# Patient Record
Sex: Female | Born: 1950 | Race: White | Hispanic: No | Smoking: Former smoker
Health system: Southern US, Community
[De-identification: ages and names within clinical notes are randomized; demographics above are authoritative.]

## PROBLEM LIST (undated history)

## (undated) DIAGNOSIS — K219 Gastro-esophageal reflux disease without esophagitis: Secondary | ICD-10-CM

## (undated) DIAGNOSIS — M1990 Unspecified osteoarthritis, unspecified site: Secondary | ICD-10-CM

## (undated) DIAGNOSIS — E7800 Pure hypercholesterolemia, unspecified: Secondary | ICD-10-CM

## (undated) DIAGNOSIS — J449 Chronic obstructive pulmonary disease, unspecified: Secondary | ICD-10-CM

## (undated) DIAGNOSIS — I739 Peripheral vascular disease, unspecified: Secondary | ICD-10-CM

## (undated) DIAGNOSIS — I10 Essential (primary) hypertension: Secondary | ICD-10-CM

## (undated) HISTORY — DX: Peripheral vascular disease, unspecified: I73.9

## (undated) HISTORY — DX: Chronic obstructive pulmonary disease, unspecified: J44.9

## (undated) HISTORY — PX: EYE SURGERY: SHX253

## (undated) HISTORY — PX: DEBRIDEMENT  FOOT: SUR387

---

## 2010-04-12 ENCOUNTER — Ambulatory Visit
Admission: RE | Admit: 2010-04-12 | Discharge: 2010-04-12 | Payer: Self-pay | Source: Home / Self Care | Attending: Vascular Surgery | Admitting: Vascular Surgery

## 2010-08-14 NOTE — Assessment & Plan Note (Signed)
OFFICE VISIT   BURDA, Pamela [name]  DOB:  April 30, 1950                                       04/12/2010  [id number]#:[id number]   CHIEF COMPLAINT:  Right foot ulcer.   HISTORY OF PRESENT ILLNESS:  The patient is a 60 year old female  referred by Dr. Nolen Mu for a nonhealing wound of her right foot.  The  patient developed an ulceration on her right first toe in September of  2011.  Dr. Nolen Mu has been doing intermittent wound care and  debridement on this but the wound has persisted.  The patient has a  lengthy history of numbness and tingling in both feet for approximately  5 years.  She has a 10 year history of diabetes.  Other chronic medical  problems include COPD, hypertension and elevated cholesterol.  These  problems are all currently controlled and followed by Dr. Clarise Cruz.   The patient denies any rest pain.  She denies any claudication symptoms.  She has had no other prior episodes of ulcerations on her feet.   PAST SURGICAL HISTORY:  C-section in 1988.   SOCIAL HISTORY:  She is disabled, unemployed.  She has 2 children.  She  is single.  She currently smokes half pack of cigarettes per day and  greater than 3 minutes were spent today regarding smoking cessation  counseling.  She does not consume alcohol regularly.   FAMILY HISTORY:  Has no history of early heart or vascular disease at  age less than 97.   REVIEW OF SYSTEMS:  Full 12 point review of systems was performed with  the patient today.  VASCULAR:  She has intermittent numbness, tingling and pain in her feet.  GENERAL:  She is 5 feet 6 inches, 231 pounds.  GI:  She has a history of reflux and hiatal hernia.  PULMONARY:  She has a history of bronchitis and wheezing.  URINARY:  She has urinary frequency.  MUSCULOSKELETAL:  She has multiple joint and arthritis pain.  All other systems were negative.   ALLERGIES:  She has allergies listed to Cipro, codeine and Azo Gantrisin  which causes  hives.   MEDICATIONS:  1. Include nabumetone 750 mg 2 tablets every day.  2. Amlodipine/benazepril 10/40 one tablet every day.  3. Lovaza 1 gram 2 capsules twice daily.  4. Allopurinol 100 mg 2 tablets once a day.  5. Omeprazole DR 20 mg once a day.  6. Vitamin D 1.25 mg every week.  7. Promethazine p.r.n.  8. L-arginine 500 mg once a day.  9. B12 once a day.  10.Alpha lipoic acid 600 mg 2 capsules every day.  11.Gabapentin 800 mg three times a day.  12.Hydroxyzine 25 mg three times a day.  13.Cyclobenzaprine 10 mg three times a day.  14.Bactrim DS 1 tablet twice a day.  15.Lantus insulin 60 units twice a day.  16.NovoLog sliding scale insulin.  17.Stool softener once daily.   PHYSICAL EXAMINATION:  Vital signs:  Blood pressure is 129/77 in the  left arm, heart rate 89 and regular.  Temperature is 98.5.  HEENT:  Unremarkable.  Neck:  Has 2+ carotid pulses without bruit.  Chest:  Clear to auscultation with a few crackles in the right base.  Cardiac:  Regular rate and rhythm without murmur.  Abdomen:  Very obese, soft,  nontender, nondistended.  No masses.  Extremities:  She has 1+ femoral  pulses bilaterally.  She has absent popliteal and pedal pulses  bilaterally.  Musculoskeletal:  Shows no obvious major joint  deformities.  Neurologic:  Shows symmetric upper extremity and lower  extremity motor strength with decreased sensation in the feet  bilaterally.  Skin:  There is a 2 cm ulceration on the plantar aspect of  the right first toe consistent with a neuropathic ulcer.  There is pale  granulation tissue and poor healing at the base of the wound.   I reviewed her right bilateral ABIs dated 03/26/2010 from [facility] which shows an ABI on the right of 0.63, left of  0.57.  Waveforms were biphasic to monophasic.   I also reviewed the results of an MRI scan that was done to rule out  osteomyelitis.  This was consistent with cellulitis but no obvious   osteomyelitis in the right first toe.   In summary, the patient has a nonhealing ulceration right foot.  I agree  with Dr. 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  I believe the best option for her would be aortogram,  bilateral lower extremity runoff, possible angioplasty and stenting or  possible consideration of a bypass operation in her right leg.  Risks,  benefits, possible complications and procedure details including but not  limited to bleeding, infection, vessel injury, contrast reaction were  explained to the patient today.  She understands and agrees to proceed.  Her angiogram is scheduled for 04/27/2010.     Janetta Hora. Fields, MD  Electronically Signed   CEF/MEDQ  D:  04/12/2010  T:  04/13/2010  Job:  [id number]   cc:   B. Theola Sequin, MD  Joya Salm

## 2011-03-29 ENCOUNTER — Emergency Department (HOSPITAL_COMMUNITY): Payer: Medicaid - Out of State

## 2011-03-29 ENCOUNTER — Inpatient Hospital Stay (HOSPITAL_COMMUNITY)
Admission: EM | Admit: 2011-03-29 | Discharge: 2011-04-12 | DRG: 617 | Disposition: A | Discharge status: Skilled Nursing Facility | Payer: Medicaid - Out of State | Attending: Internal Medicine | Admitting: Internal Medicine

## 2011-03-29 ENCOUNTER — Encounter: Payer: Self-pay | Admitting: Emergency Medicine

## 2011-03-29 DIAGNOSIS — I70269 Atherosclerosis of native arteries of extremities with gangrene, unspecified extremity: Secondary | ICD-10-CM | POA: Diagnosis present

## 2011-03-29 DIAGNOSIS — L03119 Cellulitis of unspecified part of limb: Secondary | ICD-10-CM | POA: Diagnosis present

## 2011-03-29 DIAGNOSIS — K219 Gastro-esophageal reflux disease without esophagitis: Secondary | ICD-10-CM | POA: Diagnosis present

## 2011-03-29 DIAGNOSIS — Z6835 Body mass index (BMI) 35.0-35.9, adult: Secondary | ICD-10-CM

## 2011-03-29 DIAGNOSIS — N189 Chronic kidney disease, unspecified: Secondary | ICD-10-CM | POA: Diagnosis present

## 2011-03-29 DIAGNOSIS — E1100 Type 2 diabetes mellitus with hyperosmolarity without nonketotic hyperglycemic-hyperosmolar coma (NKHHC): Secondary | ICD-10-CM | POA: Diagnosis present

## 2011-03-29 DIAGNOSIS — E875 Hyperkalemia: Secondary | ICD-10-CM | POA: Diagnosis not present

## 2011-03-29 DIAGNOSIS — R739 Hyperglycemia, unspecified: Secondary | ICD-10-CM

## 2011-03-29 DIAGNOSIS — E119 Type 2 diabetes mellitus without complications: Secondary | ICD-10-CM | POA: Diagnosis present

## 2011-03-29 DIAGNOSIS — E11621 Type 2 diabetes mellitus with foot ulcer: Secondary | ICD-10-CM | POA: Diagnosis present

## 2011-03-29 DIAGNOSIS — K5900 Constipation, unspecified: Secondary | ICD-10-CM | POA: Diagnosis not present

## 2011-03-29 DIAGNOSIS — M7989 Other specified soft tissue disorders: Secondary | ICD-10-CM | POA: Diagnosis not present

## 2011-03-29 DIAGNOSIS — I739 Peripheral vascular disease, unspecified: Secondary | ICD-10-CM | POA: Diagnosis present

## 2011-03-29 DIAGNOSIS — E871 Hypo-osmolality and hyponatremia: Secondary | ICD-10-CM | POA: Diagnosis present

## 2011-03-29 DIAGNOSIS — E1149 Type 2 diabetes mellitus with other diabetic neurological complication: Secondary | ICD-10-CM | POA: Diagnosis present

## 2011-03-29 DIAGNOSIS — L02619 Cutaneous abscess of unspecified foot: Secondary | ICD-10-CM | POA: Diagnosis present

## 2011-03-29 DIAGNOSIS — L97509 Non-pressure chronic ulcer of other part of unspecified foot with unspecified severity: Secondary | ICD-10-CM | POA: Diagnosis present

## 2011-03-29 DIAGNOSIS — R259 Unspecified abnormal involuntary movements: Secondary | ICD-10-CM | POA: Diagnosis not present

## 2011-03-29 DIAGNOSIS — L03116 Cellulitis of left lower limb: Secondary | ICD-10-CM

## 2011-03-29 DIAGNOSIS — I129 Hypertensive chronic kidney disease with stage 1 through stage 4 chronic kidney disease, or unspecified chronic kidney disease: Secondary | ICD-10-CM | POA: Diagnosis present

## 2011-03-29 DIAGNOSIS — I5030 Unspecified diastolic (congestive) heart failure: Secondary | ICD-10-CM | POA: Diagnosis present

## 2011-03-29 DIAGNOSIS — I10 Essential (primary) hypertension: Secondary | ICD-10-CM

## 2011-03-29 DIAGNOSIS — E1165 Type 2 diabetes mellitus with hyperglycemia: Principal | ICD-10-CM | POA: Diagnosis present

## 2011-03-29 DIAGNOSIS — M1990 Unspecified osteoarthritis, unspecified site: Secondary | ICD-10-CM | POA: Diagnosis present

## 2011-03-29 DIAGNOSIS — F17200 Nicotine dependence, unspecified, uncomplicated: Secondary | ICD-10-CM | POA: Diagnosis present

## 2011-03-29 DIAGNOSIS — I509 Heart failure, unspecified: Secondary | ICD-10-CM | POA: Diagnosis present

## 2011-03-29 DIAGNOSIS — M109 Gout, unspecified: Secondary | ICD-10-CM | POA: Diagnosis present

## 2011-03-29 DIAGNOSIS — IMO0002 Reserved for concepts with insufficient information to code with codable children: Principal | ICD-10-CM | POA: Diagnosis present

## 2011-03-29 DIAGNOSIS — E7800 Pure hypercholesterolemia, unspecified: Secondary | ICD-10-CM | POA: Diagnosis present

## 2011-03-29 DIAGNOSIS — Z01810 Encounter for preprocedural cardiovascular examination: Secondary | ICD-10-CM

## 2011-03-29 DIAGNOSIS — J4489 Other specified chronic obstructive pulmonary disease: Secondary | ICD-10-CM | POA: Diagnosis present

## 2011-03-29 DIAGNOSIS — N183 Chronic kidney disease, stage 3 unspecified: Secondary | ICD-10-CM

## 2011-03-29 DIAGNOSIS — J449 Chronic obstructive pulmonary disease, unspecified: Secondary | ICD-10-CM | POA: Diagnosis present

## 2011-03-29 DIAGNOSIS — E1142 Type 2 diabetes mellitus with diabetic polyneuropathy: Secondary | ICD-10-CM | POA: Diagnosis present

## 2011-03-29 DIAGNOSIS — Z794 Long term (current) use of insulin: Secondary | ICD-10-CM

## 2011-03-29 HISTORY — DX: Pure hypercholesterolemia, unspecified: E78.00

## 2011-03-29 HISTORY — DX: Gastro-esophageal reflux disease without esophagitis: K21.9

## 2011-03-29 HISTORY — DX: Unspecified osteoarthritis, unspecified site: M19.90

## 2011-03-29 HISTORY — DX: Essential (primary) hypertension: I10

## 2011-03-29 LAB — DIFFERENTIAL
Basophils Relative: 0 % (ref 0–1)
Eosinophils Absolute: 0 10*3/uL (ref 0.0–0.7)
Lymphs Abs: 2.8 10*3/uL (ref 0.7–4.0)
Neutro Abs: 15.7 10*3/uL — ABNORMAL HIGH (ref 1.7–7.7)
Neutrophils Relative %: 77 % (ref 43–77)

## 2011-03-29 LAB — CBC
MCH: 31.7 pg (ref 26.0–34.0)
MCHC: 33.1 g/dL (ref 30.0–36.0)
Platelets: 270 10*3/uL (ref 150–400)
RBC: 3.41 MIL/uL — ABNORMAL LOW (ref 3.87–5.11)

## 2011-03-29 MED ORDER — SODIUM CHLORIDE 0.9 % IV SOLN: INTRAVENOUS | Status: DC

## 2011-03-29 MED ORDER — PIPERACILLIN-TAZOBACTAM 3.375 G IVPB
3.3750 g | Freq: Once | INTRAVENOUS | Status: AC
  Administered 2011-03-29: 3.375 g via INTRAVENOUS
  Filled 2011-03-29: qty 50

## 2011-03-29 MED ORDER — HYDROMORPHONE HCL PF 1 MG/ML IJ SOLN
1.0000 mg | Freq: Once | INTRAMUSCULAR | Status: AC
  Administered 2011-03-29: 1 mg via INTRAVENOUS
  Filled 2011-03-29: qty 1

## 2011-03-29 MED ORDER — ONDANSETRON HCL 4 MG/2ML IJ SOLN
4.0000 mg | Freq: Once | INTRAMUSCULAR | Status: AC
  Administered 2011-03-29: 4 mg via INTRAVENOUS
  Filled 2011-03-29: qty 2

## 2011-03-29 MED ORDER — VANCOMYCIN HCL IN DEXTROSE 1-5 GM/200ML-% IV SOLN
1000.0000 mg | Freq: Once | INTRAVENOUS | Status: AC
  Administered 2011-03-29: 1000 mg via INTRAVENOUS
  Filled 2011-03-29: qty 200

## 2011-03-29 NOTE — ED Notes (Signed)
Pt requesting to have something to drink, advised her that she needed to wait until seen by MD.  Pt st's she was gonna have something to drink, sent her daughter to get a Coke.  Pt also stated that she was gonna take a pain pill and proceded to put it in her mouth and swallow it.

## 2011-03-29 NOTE — ED Provider Notes (Signed)
History     CSN: [id number]  Arrival date & time 03/29/11  1749   First MD Initiated Contact with Patient 03/29/11 2234      Chief Complaint  Patient presents with  . Foot Pain    (Consider location/radiation/quality/duration/timing/severity/associated sxs/prior treatment) HPI Comments: Patient with history of diabetes recently being treated for ulceration of plantar aspect of left foot -- presents with worsening ulcer on the top of her foot over the base of the fourth and fifth toes. The patient has had some pain in this area and worsening redness around the ulceration. The patient's daughter noted that the toe went from a white color to a purplish color today. There has been minimal oozing from the wound. The patient has had occasional fevers for the past week. She denies any nausea, vomiting, and diarrhea. Patient has been taking Percocet at home for pain.  Patient is a 60 y.o. female presenting with lower extremity pain. The history is provided by the patient.  Foot Pain This is a new problem. The current episode started in the past 7 days. The problem has been rapidly worsening. Associated symptoms include a fever and a rash. Pertinent negatives include no abdominal pain, arthralgias, chest pain, chills, congestion, coughing, headaches, myalgias, nausea, sore throat or vomiting. The symptoms are aggravated by nothing. She has tried nothing for the symptoms.  Foot Pain This is a new problem. The current episode started in the past 7 days. The problem has been rapidly worsening. Pertinent negatives include no chest pain, no abdominal pain, no headaches and no shortness of breath. The symptoms are aggravated by nothing. She has tried nothing for the symptoms.    Past Medical History  Diagnosis Date  . Diabetes mellitus   . GERD (gastroesophageal reflux disease)   . Gout   . Hypertension   . High cholesterol     Past Surgical History  Procedure Date  . Cesarean section     No  family history on file.  History  Substance Use Topics  . Smoking status: Passive Smoker  . Smokeless tobacco: Not on file  . Alcohol Use: No    OB History    Grav Para Term Preterm Abortions TAB SAB Ect Mult Living                  Review of Systems  Constitutional: Positive for fever. Negative for chills.  HENT: Negative for congestion, sore throat and rhinorrhea.   Eyes: Negative for discharge.  Respiratory: Negative for cough and shortness of breath.   Cardiovascular: Negative for chest pain.  Gastrointestinal: Negative for nausea, vomiting, abdominal pain, diarrhea and constipation.  Genitourinary: Negative for dysuria.  Musculoskeletal: Negative for myalgias and arthralgias.  Skin: Positive for color change, rash and wound.  Neurological: Negative for headaches.  Psychiatric/Behavioral: Negative for confusion.    Allergies  Azo and Codeine  Home Medications   Current Outpatient Rx  Name Route Sig Dispense Refill  . ALLOPURINOL 300 MG PO TABS Oral Take 300 mg by mouth daily.      Marland Kitchen AMLODIPINE BESYLATE 10 MG PO TABS Oral Take 10 mg by mouth daily.      . ATORVASTATIN CALCIUM 10 MG PO TABS Oral Take 10 mg by mouth daily.      Marland Kitchen VITAMIN B-12 PO Oral Take 1 tablet by mouth daily.      . CYCLOBENZAPRINE HCL 10 MG PO TABS Oral Take 10 mg by mouth 3 (three) times daily.      Marland Kitchen  DOXYCYCLINE HYCLATE 100 MG PO CAPS Oral Take 100 mg by mouth 2 (two) times daily.      . ERGOCALCIFEROL 50000 UNITS PO CAPS Oral Take 50,000 Units by mouth once a week.      Marland Kitchen GABAPENTIN 800 MG PO TABS Oral Take 800 mg by mouth 3 (three) times daily.      Marland Kitchen HYDROCHLOROTHIAZIDE 25 MG PO TABS Oral Take 25 mg by mouth daily.      Marland Kitchen HYDROXYZINE HCL 25 MG PO TABS Oral Take 25 mg by mouth every 6 (six) hours as needed. For itching     . INSULIN ASPART 100 UNIT/ML [place] SOLN Subcutaneous Inject 12-30 Units into the skin 2 (two) times daily. Per sliding scale     . INSULIN GLARGINE 100 UNIT/ML [place] SOLN  Subcutaneous Inject 60 Units into the skin 2 (two) times daily.      Marland Kitchen LOSARTAN POTASSIUM 100 MG PO TABS Oral Take 100 mg by mouth daily.      . OMEGA-3-ACID ETHYL ESTERS 1 G PO CAPS Oral Take 4 g by mouth daily.      . OXYCODONE-ACETAMINOPHEN 5-500 MG PO TABS Oral Take 1 tablet by mouth every 8 (eight) hours as needed. For pain     . PROMETHAZINE HCL 25 MG PO TABS Oral Take 25 mg by mouth every 6 (six) hours as needed. For nausea     . SULFAMETHOXAZOLE-TMP DS 800-160 MG PO TABS Oral Take 1 tablet by mouth 2 (two) times daily.        BP 121/53  Pulse 80  Temp(Src) 99.8 F (37.7 C) (Oral)  Resp 18  SpO2 93%  Physical Exam  Nursing note and vitals reviewed. Constitutional: She is oriented to person, place, and time. She appears well-developed and well-nourished.  HENT:  Head: Normocephalic and atraumatic.  Eyes: Right eye exhibits no discharge. Left eye exhibits no discharge.  Neck: Normal range of motion. Neck supple.  Cardiovascular: Normal rate and regular rhythm.  Exam reveals no gallop and no friction rub.   No murmur heard. Pulmonary/Chest: Effort normal and breath sounds normal. No respiratory distress. She has no wheezes.  Abdominal: Soft. There is no tenderness. There is no rebound and no guarding.  Musculoskeletal: Normal range of motion. She exhibits edema and tenderness.       Feet:       Ulceration open wounds over the volar aspect of the fourth and fifth base of toes. There is surrounding redness on the foot consistent with cellulitis. There is no streaking or lymphangitis.  Neurological: She is alert and oriented to person, place, and time. She has normal strength.  Skin: Skin is warm and dry. No rash noted.  Psychiatric: She has a normal mood and affect.    ED Course  Procedures (including critical care time)  Labs Reviewed  CBC - Abnormal; Notable for the following:    WBC 20.5 (*)    RBC 3.41 (*)    Hemoglobin 10.8 (*)    HCT 32.6 (*)    All other  components within normal limits  DIFFERENTIAL - Abnormal; Notable for the following:    Neutro Abs 15.7 (*)    Monocytes Absolute 1.9 (*)    All other components within normal limits  BASIC METABOLIC PANEL - Abnormal; Notable for the following:    Sodium 120 (*) REPEATED TO VERIFY   Chloride 83 (*)    Glucose, Bld 782 (*)    BUN 25 (*)  Creatinine, Ser 1.53 (*)    GFR calc non Af Amer 36 (*)    GFR calc Af Amer 41 (*)    All other components within normal limits  POCT CBG MONITORING   Dg Foot Complete Left  03/29/2011  *RADIOLOGY REPORT*  Clinical Data: Diabetic ulcer the base of the fifth toe.  LEFT FOOT - COMPLETE 3+ VIEW  Comparison: None.  Findings: Soft tissue swelling and soft tissue defect in the lateral aspect of the forefoot over the ankle metatarsal phalangeal region.  There is a suggestion of small gas bubbles in the subcutaneous tissues which might represent soft tissue abscess or infection.  The underlying bones appear intact without evidence of cortical erosion or sclerosis.  No obvious changes of osteomyelitis.  There is thickening of the bone cortex along the shaft of the fourth metatarsal bone which probably represents old healed fracture or stress reaction.  Mild degenerative changes in the intertarsal and tarsometatarsal joints.  Accessory navicular ossicle.  Small plantar calcaneal spur.  Vascular calcifications.  IMPRESSION: Soft tissue defect, swelling, and soft tissue gas in the lateral aspect of the left forefoot consistent with soft tissue infection. No specific evidence of osteomyelitis.  Cortical thickening along the shaft of the fourth metatarsal bone probably representing old fracture deformity or stress reaction.  Degenerative changes in the tarsal joints.  Original Report Authenticated By: Marlon Pel, M.D.     1. Diabetic foot ulcer   2. Cellulitis of left foot   3. Hyperglycemia     11:48 PM Patient seen and examined.  11:49 PM Patient was  discussed with Sunnie Nielsen, MD Seen by Dr. Dierdre Highman. Will admit. X-ray foot reviewed by myself.  12:24 AM [facility] team 5 to admit patient. Patient has elevated glucose, glucose stabilizer protocol started.   MDM  Admit for diabetic foot ulcer, cellulitis, IV abx, hyperglycemia.    Medical screening examination/treatment/procedure(s) were performed by non-physician practitioner and as supervising physician I was immediately available for consultation/collaboration. Osvaldo Human, M.D.    Eustace Moore [place], [place] 03/30/11 0025  Carleene Cooper III, MD 03/30/11 [phone]

## 2011-03-29 NOTE — ED Notes (Signed)
Pt was sent here for infection to left foot.  Pt has been being tx for diabetic ulcer to foot.

## 2011-03-30 ENCOUNTER — Encounter (HOSPITAL_COMMUNITY): Payer: Self-pay | Admitting: Pulmonary Disease

## 2011-03-30 DIAGNOSIS — I10 Essential (primary) hypertension: Secondary | ICD-10-CM | POA: Diagnosis present

## 2011-03-30 DIAGNOSIS — L97509 Non-pressure chronic ulcer of other part of unspecified foot with unspecified severity: Secondary | ICD-10-CM | POA: Diagnosis present

## 2011-03-30 DIAGNOSIS — E119 Type 2 diabetes mellitus without complications: Secondary | ICD-10-CM | POA: Diagnosis present

## 2011-03-30 DIAGNOSIS — L98499 Non-pressure chronic ulcer of skin of other sites with unspecified severity: Secondary | ICD-10-CM

## 2011-03-30 DIAGNOSIS — E871 Hypo-osmolality and hyponatremia: Secondary | ICD-10-CM | POA: Diagnosis present

## 2011-03-30 DIAGNOSIS — Z01810 Encounter for preprocedural cardiovascular examination: Secondary | ICD-10-CM

## 2011-03-30 DIAGNOSIS — M109 Gout, unspecified: Secondary | ICD-10-CM | POA: Diagnosis not present

## 2011-03-30 DIAGNOSIS — K219 Gastro-esophageal reflux disease without esophagitis: Secondary | ICD-10-CM | POA: Diagnosis present

## 2011-03-30 DIAGNOSIS — I739 Peripheral vascular disease, unspecified: Secondary | ICD-10-CM

## 2011-03-30 LAB — GLUCOSE, CAPILLARY
Glucose-Capillary: 288 mg/dL — ABNORMAL HIGH (ref 70–99)
Glucose-Capillary: 423 mg/dL — ABNORMAL HIGH (ref 70–99)
Glucose-Capillary: >600 mg/dL (ref 70–99)

## 2011-03-30 LAB — BASIC METABOLIC PANEL
BUN: 25 mg/dL — ABNORMAL HIGH (ref 6–23)
Chloride: 83 mEq/L — ABNORMAL LOW (ref 96–112)
Chloride: 91 mEq/L — ABNORMAL LOW (ref 96–112)
GFR calc Af Amer: 41 mL/min — ABNORMAL LOW (ref >90)
GFR calc Af Amer: 48 mL/min — ABNORMAL LOW (ref >90)
GFR calc non Af Amer: 36 mL/min — ABNORMAL LOW (ref >90)
Potassium: 4.2 mEq/L (ref 3.5–5.1)
Potassium: 3.6 mEq/L (ref 3.5–5.1)
Sodium: 120 mEq/L — ABNORMAL LOW (ref 135–145)

## 2011-03-30 LAB — CBC
HCT: 29.5 % — ABNORMAL LOW (ref 36.0–46.0)
Hemoglobin: 10 g/dL — ABNORMAL LOW (ref 12.0–15.0)
WBC: 17.7 10*3/uL — ABNORMAL HIGH (ref 4.0–10.5)

## 2011-03-30 LAB — MRSA PCR SCREENING: MRSA by PCR: NEGATIVE

## 2011-03-30 MED ORDER — ALUM & MAG HYDROXIDE-SIMETH 200-200-20 MG/5ML PO SUSP: 30.0000 mL | Freq: Four times a day (QID) | ORAL | Status: DC | PRN

## 2011-03-30 MED ORDER — ACETAMINOPHEN 325 MG PO TABS
650.0000 mg | ORAL_TABLET | Freq: Four times a day (QID) | ORAL | Status: DC | PRN
  Administered 2011-03-31 – 2011-04-05 (×5): 650 mg via ORAL
  Filled 2011-03-30 (×5): qty 2

## 2011-03-30 MED ORDER — ACETAMINOPHEN 650 MG RE SUPP
650.0000 mg | Freq: Four times a day (QID) | RECTAL | Status: DC | PRN
  Administered 2011-04-03: 650 mg via RECTAL
  Filled 2011-03-30: qty 1

## 2011-03-30 MED ORDER — DEXTROSE-NACL 5-0.45 % IV SOLN: INTRAVENOUS | Status: DC

## 2011-03-30 MED ORDER — ONDANSETRON HCL 4 MG/2ML IJ SOLN: 4.0000 mg | Freq: Four times a day (QID) | INTRAMUSCULAR | Status: DC | PRN

## 2011-03-30 MED ORDER — DEXTROSE 50 % IV SOLN: 25.0000 mL | INTRAVENOUS | Status: DC | PRN

## 2011-03-30 MED ORDER — DEXTROSE-NACL 5-0.45 % IV SOLN
INTRAVENOUS | Status: DC
  Administered 2011-03-30: 03:00:00 via INTRAVENOUS

## 2011-03-30 MED ORDER — INSULIN GLARGINE 100 UNIT/ML ~~LOC~~ SOLN
60.0000 [IU] | Freq: Two times a day (BID) | SUBCUTANEOUS | Status: DC
  Administered 2011-03-30 – 2011-03-31 (×3): 60 [IU] via SUBCUTANEOUS
  Filled 2011-03-30: qty 3

## 2011-03-30 MED ORDER — VANCOMYCIN HCL IN DEXTROSE 1-5 GM/200ML-% IV SOLN
1000.0000 mg | Freq: Two times a day (BID) | INTRAVENOUS | Status: DC
  Administered 2011-03-30: 1000 mg via INTRAVENOUS
  Filled 2011-03-30 (×2): qty 200

## 2011-03-30 MED ORDER — VANCOMYCIN HCL 1000 MG IV SOLR
1500.0000 mg | INTRAVENOUS | Status: DC
  Administered 2011-03-30 – 2011-04-01 (×3): 1500 mg via INTRAVENOUS
  Filled 2011-03-30 (×4): qty 1500

## 2011-03-30 MED ORDER — PIPERACILLIN-TAZOBACTAM 3.375 G IVPB
3.3750 g | Freq: Three times a day (TID) | INTRAVENOUS | Status: DC
  Administered 2011-03-30 – 2011-04-01 (×8): 3.375 g via INTRAVENOUS
  Filled 2011-03-30 (×10): qty 50

## 2011-03-30 MED ORDER — AMLODIPINE BESYLATE 10 MG PO TABS
10.0000 mg | ORAL_TABLET | Freq: Every day | ORAL | Status: DC
  Administered 2011-03-30 – 2011-04-12 (×13): 10 mg via ORAL
  Filled 2011-03-30 (×14): qty 1

## 2011-03-30 MED ORDER — ENOXAPARIN SODIUM 40 MG/0.4ML ~~LOC~~ SOLN
40.0000 mg | Freq: Every day | SUBCUTANEOUS | Status: DC
  Administered 2011-03-30 – 2011-04-02 (×4): 40 mg via SUBCUTANEOUS
  Filled 2011-03-30 (×5): qty 0.4

## 2011-03-30 MED ORDER — SODIUM CHLORIDE 0.9 % IV SOLN
INTRAVENOUS | Status: AC
  Administered 2011-03-30: 7.2 [IU]/h via INTRAVENOUS
  Filled 2011-03-30: qty 1

## 2011-03-30 MED ORDER — HYDROMORPHONE HCL PF 1 MG/ML IJ SOLN
0.5000 mg | INTRAMUSCULAR | Status: DC | PRN
  Administered 2011-03-31 – 2011-04-02 (×3): 1 mg via INTRAVENOUS
  Administered 2011-04-02 (×2): 0.5 mg via INTRAVENOUS
  Administered 2011-04-02 – 2011-04-03 (×4): 1 mg via INTRAVENOUS
  Filled 2011-03-30 (×9): qty 1

## 2011-03-30 MED ORDER — ONDANSETRON HCL 4 MG PO TABS: 4.0000 mg | ORAL_TABLET | Freq: Four times a day (QID) | ORAL | Status: DC | PRN

## 2011-03-30 MED ORDER — OXYCODONE HCL 5 MG PO TABS
5.0000 mg | ORAL_TABLET | ORAL | Status: DC | PRN
  Administered 2011-03-30 – 2011-04-02 (×6): 5 mg via ORAL
  Filled 2011-03-30 (×6): qty 1

## 2011-03-30 MED ORDER — GABAPENTIN 800 MG PO TABS
800.0000 mg | ORAL_TABLET | Freq: Three times a day (TID) | ORAL | Status: DC
  Administered 2011-03-30 – 2011-04-03 (×15): 800 mg via ORAL
  Filled 2011-03-30 (×18): qty 1

## 2011-03-30 MED ORDER — SODIUM CHLORIDE 0.9 % IV SOLN
INTRAVENOUS | Status: DC
  Administered 2011-03-30: 1000 mL via INTRAVENOUS
  Administered 2011-03-30 – 2011-03-31 (×2): via INTRAVENOUS

## 2011-03-30 MED ORDER — COLLAGENASE 250 UNIT/GM EX OINT
TOPICAL_OINTMENT | Freq: Two times a day (BID) | CUTANEOUS | Status: DC
  Administered 2011-03-30 – 2011-03-31 (×3): via TOPICAL
  Administered 2011-04-01 (×2): 1 via TOPICAL
  Administered 2011-04-02 – 2011-04-03 (×4): via TOPICAL
  Filled 2011-03-30: qty 30

## 2011-03-30 MED ORDER — ALLOPURINOL 300 MG PO TABS
300.0000 mg | ORAL_TABLET | Freq: Every day | ORAL | Status: DC
  Administered 2011-03-30 – 2011-04-12 (×13): 300 mg via ORAL
  Filled 2011-03-30 (×14): qty 1

## 2011-03-30 MED ORDER — SODIUM CHLORIDE 0.9 % IV SOLN: INTRAVENOUS | Status: DC

## 2011-03-30 MED ORDER — INSULIN ASPART 100 UNIT/ML ~~LOC~~ SOLN
0.0000 [IU] | Freq: Three times a day (TID) | SUBCUTANEOUS | Status: DC
  Administered 2011-03-30: 5 [IU] via SUBCUTANEOUS
  Administered 2011-03-30: 11 [IU] via SUBCUTANEOUS
  Administered 2011-03-31 (×2): 8 [IU] via SUBCUTANEOUS
  Administered 2011-03-31: 11 [IU] via SUBCUTANEOUS
  Administered 2011-04-01 – 2011-04-02 (×2): 3 [IU] via SUBCUTANEOUS
  Administered 2011-04-02 – 2011-04-03 (×2): 2 [IU] via SUBCUTANEOUS
  Administered 2011-04-03: 3 [IU] via SUBCUTANEOUS
  Administered 2011-04-03: 5 [IU] via SUBCUTANEOUS
  Filled 2011-03-30: qty 3

## 2011-03-30 MED ORDER — INSULIN ASPART 100 UNIT/ML ~~LOC~~ SOLN
0.0000 [IU] | Freq: Every day | SUBCUTANEOUS | Status: DC
  Administered 2011-03-30 – 2011-03-31 (×2): 3 [IU] via SUBCUTANEOUS
  Administered 2011-04-04: 2 [IU] via SUBCUTANEOUS
  Filled 2011-03-30: qty 3

## 2011-03-30 MED ORDER — INSULIN REGULAR BOLUS VIA INFUSION: 0.0000 [IU] | Freq: Three times a day (TID) | INTRAVENOUS | Status: DC

## 2011-03-30 MED ORDER — ZOLPIDEM TARTRATE 5 MG PO TABS: 5.0000 mg | ORAL_TABLET | Freq: Every evening | ORAL | Status: DC | PRN

## 2011-03-30 MED ORDER — INSULIN REGULAR BOLUS VIA INFUSION
0.0000 [IU] | Freq: Three times a day (TID) | INTRAVENOUS | Status: DC
  Administered 2011-03-30: 7.4 [IU]/h via INTRAVENOUS
  Administered 2011-03-30: 4.7 [IU]/h via INTRAVENOUS
  Administered 2011-03-30: 5.2 [IU]/h via INTRAVENOUS
  Filled 2011-03-30 (×7): qty 10

## 2011-03-30 NOTE — ED Notes (Signed)
Report called to [place], RN [phone])

## 2011-03-30 NOTE — ED Notes (Signed)
CBG was critical high.

## 2011-03-30 NOTE — Progress Notes (Signed)
ANTIBIOTIC CONSULT NOTE - INITIAL  Pharmacy Consult for Vancomycin and Zosyn Indication: Left foot ulcer  Allergies  Allergen Reactions  . Azo (Phenazopyridine Hcl) Itching, Swelling and Rash  . Codeine Itching and Rash    Patient Measurements: Height 66 inches Weight 100 kg  Vital Signs: Temp: 99 F (37.2 C) (12/28 2251) Temp src: Oral (12/28 2251) BP: 111/43 mmHg (12/29 0130) Pulse Rate: 76  (12/29 0130)  Labs:  Basename 03/29/11 2259  WBC 20.5*  HGB 10.8*  PLT 270  LABCREA --  CREATININE 1.53*   Microbiology: No results found for this or any previous visit (from the past 720 hour(s)).  Medical History: Past Medical History  Diagnosis Date  . Diabetes mellitus   . GERD (gastroesophageal reflux disease)   . Gout   . Hypertension   . High cholesterol     Medications:  Allopurinol  Norvasc  Lipitor  Flexeril  Vit B12  Vit D  Neurontin  HCTZ  Atarax  Novolog  Lantus  Cozaar  Lovaza  Bactrim  Assessment: 60 yo female with diabetic foot wound for empiric antibiotics.  Vancomycin 1 g and Zosyn given in ED at 11:30 pm 12/28  Goal of Therapy:  Vancomycin trough 10-15   Plan:  Vancomycin 1 g IV q12h Zosyn 3.375 g IV q8h   Weyman Bogdon, Gary Fleet 03/30/2011,2:12 AM

## 2011-03-30 NOTE — Consult Note (Signed)
[facility] Consult Note  Requesting Physiciain: Dr Marcell Anger, [place]  Reason for consultation: Left foot ulcer  HISTORY OF PRESENT ILLNESS: The patient is a 60 year old female  referred by Dr. Nolen Mu for a nonhealing wound of her right foot one year ago.  Consideration was given for an arteriogram at that time but the ulcer healed spontaneously.  ABI were in the 0.6 range bilat. At that time.  Approximately one week ago the patient developed a new ulcer on the left 5th toe which has rapidly progressed.  She was seen by Dr. Alona Bene with Podiatry in [place] and referred to the [facility] ER.  She has had some drainage from the wound which is not really painful.  She has also had some fever.  The patient has a  lengthy history of numbness and tingling in both feet for approximately  6 years. She has a 11 year history of diabetes. Other chronic medical  problems include COPD, hypertension and elevated cholesterol. Her diabetes is currently poorly controlled requiring IV insulin.  The patient denies any rest pain. She denies any claudication symptoms.   PAST SURGICAL HISTORY: C-section in 1988.   SOCIAL HISTORY: She is disabled, unemployed. She has 2 children. She  is single. She 1-2 cigarettes per day but was a heavier smoker in the past. She does not consume alcohol regularly.   FAMILY HISTORY: Has no history of early heart or vascular disease at  age less than 41.   REVIEW OF SYSTEMS: Full 12 point review of systems was performed with  the patient today.  VASCULAR: She has intermittent numbness, tingling and pain in her feet.  GENERAL: She is 5 feet 6 inches, 231 pounds.  GI: She has a history of reflux and hiatal hernia.  PULMONARY: She has a history of bronchitis and wheezing.  URINARY: She has urinary frequency.  MUSCULOSKELETAL: She has multiple joint and arthritis pain.  All other systems were negative.   ALLERGIES: She has allergies listed to Cipro, codeine and Azo Gantrisin    which causes hives.   MEDICATIONS:  Current Facility-Administered Medications  Medication Dose Route Frequency Provider Last Rate Last Dose  . 0.9 %  sodium chloride infusion   Intravenous Continuous Sunnie Nielsen, MD      . acetaminophen (TYLENOL) tablet 650 mg  650 mg Oral Q6H PRN Ron Parker, MD       Or  . acetaminophen (TYLENOL) suppository 650 mg  650 mg Rectal Q6H PRN Ron Parker, MD      . alum & mag hydroxide-simeth (MAALOX/MYLANTA) 200-200-20 MG/5ML suspension 30 mL  30 mL Oral Q6H PRN Ron Parker, MD      . dextrose 5 %-0.45 % sodium chloride infusion   Intravenous Continuous Ron Parker, MD 125 mL/hr at 03/30/11 0258    . dextrose 50 % solution 25 mL  25 mL Intravenous PRN Ron Parker, MD      . enoxaparin (LOVENOX) injection 40 mg  40 mg Subcutaneous Daily Harvette Velora Heckler, MD      . HYDROmorphone (DILAUDID) injection 0.5-1 mg  0.5-1 mg Intravenous Q3H PRN Ron Parker, MD      . HYDROmorphone (DILAUDID) injection 1 mg  1 mg Intravenous Once Carolee Rota, PA   1 mg at 03/29/11 2322  . insulin regular (NOVOLIN R,HUMULIN R) 1 Units/mL in sodium chloride 0.9 % 100 mL infusion   Intravenous To Major Sunnie Nielsen, MD 7.2 mL/hr at 03/30/11 0103 7.2  Units/hr at 03/30/11 0103  . insulin regular (NOVOLIN R,HUMULIN R) 1 Units/mL in sodium chloride 0.9 % 100 mL infusion   Intravenous Continuous Ron Parker, MD 8.4 mL/hr at 03/30/11 0643 8.4 Units/hr at 03/30/11 0643  . insulin regular bolus via infusion 0-10 Units  0-10 Units Intravenous TID WC Gary Fleet Abbott, PHARMD      . ondansetron [facility]) injection 4 mg  4 mg Intravenous Once Carolee Rota, PA   4 mg at 03/29/11 2322  . ondansetron (ZOFRAN) tablet 4 mg  4 mg Oral Q6H PRN Ron Parker, MD       Or  . ondansetron (ZOFRAN) injection 4 mg  4 mg Intravenous Q6H PRN Ron Parker, MD      . oxyCODONE (Oxy IR/ROXICODONE) immediate release tablet 5 mg  5 mg Oral Q4H PRN  Ron Parker, MD      . piperacillin-tazobactam (ZOSYN) IVPB 3.375 g  3.375 g Intravenous Once Carolee Rota, PA   3.375 g at 03/29/11 2322  . piperacillin-tazobactam (ZOSYN) IVPB 3.375 g  3.375 g Intravenous Q8H Gary Fleet Abbott, PHARMD   3.375 g at 03/30/11 0544  . vancomycin (VANCOCIN) IVPB 1000 mg/200 mL premix  1,000 mg Intravenous Once Carolee Rota, PA   1,000 mg at 03/29/11 2323  . vancomycin (VANCOCIN) IVPB 1000 mg/200 mL premix  1,000 mg Intravenous Q12H Gary Fleet Abbott, PHARMD   1,000 mg at 03/30/11 0544  . zolpidem (AMBIEN) tablet 5 mg  5 mg Oral QHS PRN Ron Parker, MD      . DISCONTD: 0.9 %  sodium chloride infusion   Intravenous Continuous Carolee Rota, [place]      . DISCONTD: 0.9 %  sodium chloride infusion   Intravenous Continuous Gary Fleet Abbott, PHARMD      . DISCONTD: dextrose 5 %-0.45 % sodium chloride infusion   Intravenous Continuous Sunnie Nielsen, MD      . DISCONTD: dextrose 5 %-0.45 % sodium chloride infusion   Intravenous Continuous Gary Fleet Abbott, PHARMD      . DISCONTD: dextrose 50 % solution 25 mL  25 mL Intravenous PRN Sunnie Nielsen, MD      . DISCONTD: insulin regular (NOVOLIN R,HUMULIN R) 1 Units/mL in sodium chloride 0.9 % 100 mL infusion   Intravenous Continuous Ron Parker, MD      . DISCONTD: insulin regular bolus via infusion 0-10 Units  0-10 Units Intravenous TID WC Sunnie Nielsen, MD      . DISCONTD: insulin regular bolus via infusion 0-10 Units  0-10 Units Intravenous TID WC Harvette Velora Heckler, MD        PHYSICAL EXAMINATION:  Filed Vitals:   03/30/11 0250  BP: 143/58  Pulse: 81  Temp: 98.6 F (37 C)  Resp: 18   HEENT: negative Neck 2+ carotid pulses no JVD Chest: non labored breathing Card: RRR sinus on monitor Abdomen: obese soft non tender Extremities: no musculoskeletal deformity, 2+ Femoral bilat, absent popliteal and pedal pulses bilat. Skin: no ulcers right foot, left foot early gangrene tip of left  fifth toe, 5 x 4 circular shaped area of full thickness skin necrosis dorsal foot over area of 5th metatarsal, non fluctuant. Erythema entire dorsal foot extending several centimeters above the ankle Neuro: decreased sensation to light touch both feet  BMET    Component Value Date/Time   NA 128* 03/30/2011 0700   K 3.6 03/30/2011 0700   CL 91* 03/30/2011 0700  CO2 27 03/30/2011 0700   GLUCOSE 237* 03/30/2011 0700   BUN 25* 03/30/2011 0700   CREATININE 1.36* 03/30/2011 0700   CALCIUM 9.6 03/30/2011 0700   GFRNONAA 41* 03/30/2011 0700   GFRAA 48* 03/30/2011 0700   CBC    Component Value Date/Time   WBC 17.7* 03/30/2011 0700   RBC 3.20* 03/30/2011 0700   HGB 10.0* 03/30/2011 0700   HCT 29.5* 03/30/2011 0700   PLT 251 03/30/2011 0700   MCV 92.2 03/30/2011 0700   MCH 31.3 03/30/2011 0700   MCHC 33.9 03/30/2011 0700   RDW 13.3 03/30/2011 0700   LYMPHSABS 2.8 03/29/2011 2259   MONOABS 1.9* 03/29/2011 2259   EOSABS 0.0 03/29/2011 2259   BASOSABS 0.1 03/29/2011 2259    Assessment: Large necrotic wound left foot with 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  Leukocytosis persists but glucose improved.  Plan: Start wound care left foot with Santyl  Currently no abscess to drain  Continue IV antibiotics  Tentatively arteriogram by my partner Dr Edilia Bo on Monday if medically stable  Foot may not be salvageable  Try to obtain cardiac stress test if possible for risk stratification as I am sure she has some underlying coronary  disease as well  Repeat ABI with toe pressures  Fabienne Bruns, MD [facility] Office: [phone] Pager: [phone]  Cc:  B. Theola Sequin, MD  [facility] Salm

## 2011-03-30 NOTE — Progress Notes (Signed)
Pharmacy Consult for Vancomycin and Zosyn  Indication: Left foot ulcer  Progress note: A:  3 yoF with diabetic foot wound for empiric antibiotics.  CrCl estimated ~48 ml/min using IBW ~60kg.  Pt received Vanc 1 gram IV and Zosyn in ED at 11:30 PM 12/28.    P: Will decrease Vancomycin dose to 1500mg  IV q 24h.  Continue zosyn 3.375 g IV q 8 h (4hour infusion).    F/U clinical course, renal function, T, WBC, and cultures as appropriate.   Haynes Hoehn, PharmD 03/30/2011 8:10 AM  Pager: [phone]

## 2011-03-30 NOTE — H&P (Addendum)
DATE OF ADMISSION:  03/30/2011  PCP:  Dr. Antony Odea in [place]   Chief Complaint: Worsening of Foot wound   HPI: Pamela [name] is an 60 y.o. female who presents to the ED with complaints of worsening redness and drainage of a wound on her left foot.  She reports having an ulcer on the bottom of her left foot which has been healing slowly, but 1 week ago the top of her foot developed a blood blister that continued to worsen.  She saw her podiatrist 1 week ago, but another ulcer began to develop. The redness progressed and the ulcer on the top of her left foot began to have purulent drainage.  She reports having fevers off and on for the past week as well, and her blood sugars have been elevated.  In the Emergency Department she was found to have a glucose level of 782. And she was started on an IV Insulin drip.  X-rays were performed on the left foot and the results were interpreted as Cellulitis at this time.  She was also started on IV antibiotic therapy of Vancomycin and Zosyn and referred for medical admission.         Past Medical History  Diagnosis Date  . Diabetes mellitus   . GERD (gastroesophageal reflux disease)   . Gout   . Hypertension   . High cholesterol        Osteoarthritis          Morbid Obesity  Past Surgical History  Procedure Date  . Cesarean section     Medications:  HOME MEDS: Prior to Admission medications   Medication Sig Start Date End Date Taking? Authorizing Provider  allopurinol (ZYLOPRIM) 300 MG tablet Take 300 mg by mouth daily.     Yes Historical Provider, MD  amLODipine (NORVASC) 10 MG tablet Take 10 mg by mouth daily.     Yes Historical Provider, MD  atorvastatin (LIPITOR) 10 MG tablet Take 10 mg by mouth daily.     Yes Historical Provider, MD  Cyanocobalamin (VITAMIN B-12 PO) Take 1 tablet by mouth daily.     Yes Historical Provider, MD  cyclobenzaprine (FLEXERIL) 10 MG tablet Take 10 mg by mouth 3 (three) times daily.     Yes Historical  Provider, MD  doxycycline (VIBRAMYCIN) 100 MG capsule Take 100 mg by mouth 2 (two) times daily.     Yes Historical Provider, MD  ergocalciferol (VITAMIN D2) 50000 UNITS capsule Take 50,000 Units by mouth once a week.     Yes Historical Provider, MD  gabapentin (NEURONTIN) 800 MG tablet Take 800 mg by mouth 3 (three) times daily.     Yes Historical Provider, MD  hydrochlorothiazide (HYDRODIURIL) 25 MG tablet Take 25 mg by mouth daily.     Yes Historical Provider, MD  hydrOXYzine (ATARAX/VISTARIL) 25 MG tablet Take 25 mg by mouth every 6 (six) hours as needed. For itching    Yes Historical Provider, MD  insulin aspart (NOVOLOG) 100 UNIT/ML injection Inject 12-30 Units into the skin 2 (two) times daily. Per sliding scale    Yes Historical Provider, MD  insulin glargine (LANTUS) 100 UNIT/ML injection Inject 60 Units into the skin 2 (two) times daily.     Yes Historical Provider, MD  losartan (COZAAR) 100 MG tablet Take 100 mg by mouth daily.     Yes Historical Provider, MD  omega-3 acid ethyl esters (LOVAZA) 1 G capsule Take 4 g by mouth daily.     Yes Historical Provider,  MD  oxycodone-acetaminophen (ROXICET) 5-500 MG per tablet Take 1 tablet by mouth every 8 (eight) hours as needed. For pain    Yes Historical Provider, MD  promethazine (PHENERGAN) 25 MG tablet Take 25 mg by mouth every 6 (six) hours as needed. For nausea    Yes Historical Provider, MD  sulfamethoxazole-trimethoprim (BACTRIM DS) 800-160 MG per tablet Take 1 tablet by mouth 2 (two) times daily.     Yes Historical Provider, MD    Allergies:  Allergies  Allergen Reactions  . Azo (Phenazopyridine Hcl) Itching, Swelling and Rash  . Codeine Itching and Rash    Social History:   reports that she has been passively smoking.  She does not have any smokeless tobacco history on file. She reports that she does not drink alcohol or use illicit drugs.  Family History: No family history on file.  Review of Systems:  The patient denies  anorexia, weight loss,vision loss, decreased hearing, hoarseness, chest pain, syncope, dyspnea on exertion, peripheral edema, balance deficits, hemoptysis, abdominal pain, melena, hematochezia, severe indigestion/heartburn, hematuria, incontinence, genital sores, suspicious skin lesions, transient blindness, difficulty walking, depression, unusual weight change, abnormal bleeding, enlarged lymph nodes, angioedema, and breast masses.   Physical Exam:  GEN:  Pleasant  Morbidly Obese 60 year old Caucasian female examined  and in no acute distress; cooperative with exam Filed Vitals:   03/29/11 2200 03/29/11 2251 03/30/11 0000 03/30/11 0130  BP: 121/53 121/53 117/63 111/43  Pulse: 80 96 79 76  Temp:  99 F (37.2 C)    TempSrc:  Oral    Resp:  19    SpO2: 93% 96% 97% 97%   Blood pressure 111/43, pulse 76, temperature 99 F (37.2 C), temperature source Oral, resp. rate 19, SpO2 97.00%. PSYCH: SHe is alert and oriented x4; does not appear anxious does not appear depressed; affect is normal HEENT: Normocephalic and Atraumatic, Mucous membranes pink; PERRLA; EOM intact; Fundi:  Benign;  No scleral icterus, Nares: Patent, Oropharynx: Clear, Edentulous, Neck:  FROM, no cervical lymphadenopathy nor thyromegaly or carotid bruit; no JVD; Breasts:: Not examined CHEST WALL: No tenderness CHEST: Normal respiration, clear to auscultation bilaterally HEART: Regular rate and rhythm; no murmurs rubs or gallops BACK: No kyphosis or scoliosis; no CVA tenderness ABDOMEN: Positive Bowel Sounds, Obese, soft non-tender; no masses, no organomegaly, +pannus; no intertriginous candida. Rectal Exam: Not done EXTREMITIES: no cyanosis, clubbing or edema;  Genitalia: not examined PULSES: 2+ and symmetric SKIN: Normal hydration no rash,  Large Wedge shaped ulceration on lateral left dorsal foot area from the 5th MTP area at the base of the 3rd MTP area with purulent exudation present, +erythematous margins  CNS: Cranial  nerves 2-12 grossly intact no focal neurologic deficit   Labs & Imaging Results for orders placed during the hospital encounter of 03/29/11 (from the past 48 hour(s))  CBC     Status: Abnormal   Collection Time   03/29/11 10:59 PM      Component Value Range Comment   WBC 20.5 (*) 4.0 - 10.5 (K/uL)    RBC 3.41 (*) 3.87 - 5.11 (MIL/uL)    Hemoglobin 10.8 (*) 12.0 - 15.0 (g/dL)    HCT 82.9 (*) 56.2 - 46.0 (%)    MCV 95.6  78.0 - 100.0 (fL)    MCH 31.7  26.0 - 34.0 (pg)    MCHC 33.1  30.0 - 36.0 (g/dL)    RDW 13.0  86.5 - 78.4 (%)    Platelets 270  150 -  400 (K/uL)   DIFFERENTIAL     Status: Abnormal   Collection Time   03/29/11 10:59 PM      Component Value Range Comment   Neutrophils Relative 77  43 - 77 (%)    Neutro Abs 15.7 (*) 1.7 - 7.7 (K/uL)    Lymphocytes Relative 14  12 - 46 (%)    Lymphs Abs 2.8  0.7 - 4.0 (K/uL)    Monocytes Relative 9  3 - 12 (%)    Monocytes Absolute 1.9 (*) 0.1 - 1.0 (K/uL)    Eosinophils Relative 0  0 - 5 (%)    Eosinophils Absolute 0.0  0.0 - 0.7 (K/uL)    Basophils Relative 0  0 - 1 (%)    Basophils Absolute 0.1  0.0 - 0.1 (K/uL)   BASIC METABOLIC PANEL     Status: Abnormal   Collection Time   03/29/11 10:59 PM      Component Value Range Comment   Sodium 120 (*) 135 - 145 (mEq/L) REPEATED TO VERIFY   Potassium 4.2  3.5 - 5.1 (mEq/L)    Chloride 83 (*) 96 - 112 (mEq/L)    CO2 22  19 - 32 (mEq/L)    Glucose, Bld 782 (*) 70 - 99 (mg/dL)    BUN 25 (*) 6 - 23 (mg/dL)    Creatinine, Ser 0.98 (*) 0.50 - 1.10 (mg/dL)    Calcium 9.3  8.4 - 10.5 (mg/dL)    GFR calc non Af Amer 36 (*) >90 (mL/min)    GFR calc Af Amer 41 (*) >90 (mL/min)   GLUCOSE, CAPILLARY     Status: Abnormal   Collection Time   03/30/11 12:37 AM      Component Value Range Comment   Glucose-Capillary >600 (*) 70 - 99 (mg/dL)   GLUCOSE, CAPILLARY     Status: Abnormal   Collection Time   03/30/11  1:52 AM      Component Value Range Comment   Glucose-Capillary >600 (*) 70 - 99  (mg/dL)    Dg Foot Complete Left  03/29/2011  *RADIOLOGY REPORT*  Clinical Data: Diabetic ulcer the base of the fifth toe.  LEFT FOOT - COMPLETE 3+ VIEW  Comparison: None.  Findings: Soft tissue swelling and soft tissue defect in the lateral aspect of the forefoot over the ankle metatarsal phalangeal region.  There is a suggestion of small gas bubbles in the subcutaneous tissues which might represent soft tissue abscess or infection.  The underlying bones appear intact without evidence of cortical erosion or sclerosis.  No obvious changes of osteomyelitis.  There is thickening of the bone cortex along the shaft of the fourth metatarsal bone which probably represents old healed fracture or stress reaction.  Mild degenerative changes in the intertarsal and tarsometatarsal joints.  Accessory navicular ossicle.  Small plantar calcaneal spur.  Vascular calcifications.  IMPRESSION: Soft tissue defect, swelling, and soft tissue gas in the lateral aspect of the left forefoot consistent with soft tissue infection. No specific evidence of osteomyelitis.  Cortical thickening along the shaft of the fourth metatarsal bone probably representing old fracture deformity or stress reaction.  Degenerative changes in the tarsal joints.  Original Report Authenticated By: Marlon Pel, M.D.      Assessment/Plan: 1.  Nonketotic Hyperosmolar Hyperglycemia 2.  Cellulitis Left Foot 3.  Diabetic Ulcer Left Foot 4.  Diabetic Neuropathy 5.  HTN 6.  Morbid Obesity  Plan:    Patient has been placed on the IV Insulin  Protocol via the Glucose-stablizer.  IVFs have been ordered.  Patient will be transitioned to her regular Diabetic regimen, and SSI coverage once she is off of the IV Insulin drip.  Cultures were sent by the EDP, and IV Vancomycin and Zosyn will continue for now.  An Orthopedic evaluation will be needed for further wound evaluation and care.  Patient's regular medications will be further reconciled.  Other  plans as per orders.    CODE STATUS:      FULL CODE          Kayo Zion C 03/30/2011, 2:06 AM

## 2011-03-30 NOTE — ED Notes (Signed)
CBG was still critical high.

## 2011-03-30 NOTE — Progress Notes (Signed)
Patient ID: Pamela [name], female   DOB: May 22, 1950, 60 y.o.   MRN: [id number] CARDIOLOGY CONSULT NOTE  Patient ID: Pamela [name] MRN: [id number] DOB/AGE: 12-17-1950 60 y.o.  Admit date: 03/29/2011 Referring Physician: Dr. Fabienne Bruns Primary Physician: None Primary Cardiologist: None, now Dr. Valera Castle Reason for Consultation: Preop clearance  HPI:   Pamela [name] is a 60 year old white female from [place] who has a history of peripheral vascular disease. She noted ulcer under her left fifth toe about 4 weeks ago. She didn't notice some discoloration on the top of her toes well. She came in with a very severe ulcer in this region. Pulses are not palpable at the feet. She has not had an ABI yet.  I was asked to see her by Dr. Darrick Penna for preop clearance.  She has multiple comorbidities including COPD with a heavy history of tobacco use, type 2 diabetes the last 10 years, hypertension, hyperlipidemia, and obesity. She denies any previous history of cardiac disease or stroke. Her renal function is still relatively good. There is no history of retinopathy.  She denies any chest pain or angina prior to admission. Even when she was ambulating, she had no claudication. She denies orthopnea, PND, palpitations, presyncope or syncope.  There is no family history of premature CAD. There is a history of hypertension. No one else in her family has diabetes.  She is divorced and lives with her son and mother in [place], [place]. She is unemployed. She does not use alcohol. She was a heavy smoker prior to admission.  Review of systems complete and found to be negative unless listed above.   Past Medical History  Diagnosis Date  . Diabetes mellitus   . GERD (gastroesophageal reflux disease)   . Gout   . Hypertension   . High cholesterol   . Arthritis     No family history on file. History   Social History  . Marital Status: Divorced    Spouse Name: N/A    Number of  Children: N/A  . Years of Education: N/A   Occupational History  . Not on file.   Social History Main Topics  . Smoking status: Passive Smoker -- 0.2 packs/day for 45 years    Types: Cigarettes  . Smokeless tobacco: Not on file  . Alcohol Use: No  . Drug Use: No  . Sexually Active: No   Other Topics Concern  . Not on file   Social History Narrative  . No narrative on file    Past Surgical History  Procedure Date  . Cesarean section      Prescriptions prior to admission  Medication Sig Dispense Refill  . allopurinol (ZYLOPRIM) 300 MG tablet Take 300 mg by mouth daily.        Marland Kitchen amLODipine (NORVASC) 10 MG tablet Take 10 mg by mouth daily.        Marland Kitchen atorvastatin (LIPITOR) 10 MG tablet Take 10 mg by mouth daily.        . Cyanocobalamin (VITAMIN B-12 PO) Take 1 tablet by mouth daily.        . cyclobenzaprine (FLEXERIL) 10 MG tablet Take 10 mg by mouth 3 (three) times daily.        Marland Kitchen doxycycline (VIBRAMYCIN) 100 MG capsule Take 100 mg by mouth 2 (two) times daily.        . ergocalciferol (VITAMIN D2) 50000 UNITS capsule Take 50,000 Units by mouth once a week.        . gabapentin (  NEURONTIN) 800 MG tablet Take 800 mg by mouth 3 (three) times daily.        . hydrochlorothiazide (HYDRODIURIL) 25 MG tablet Take 25 mg by mouth daily.        . hydrOXYzine (ATARAX/VISTARIL) 25 MG tablet Take 25 mg by mouth every 6 (six) hours as needed. For itching       . insulin aspart (NOVOLOG) 100 UNIT/ML injection Inject 12-30 Units into the skin 2 (two) times daily. Per sliding scale       . insulin glargine (LANTUS) 100 UNIT/ML injection Inject 60 Units into the skin 2 (two) times daily.        Marland Kitchen losartan (COZAAR) 100 MG tablet Take 100 mg by mouth daily.        Marland Kitchen omega-3 acid ethyl esters (LOVAZA) 1 G capsule Take 4 g by mouth daily.        Marland Kitchen oxycodone-acetaminophen (ROXICET) 5-500 MG per tablet Take 1 tablet by mouth every 8 (eight) hours as needed. For pain       . promethazine (PHENERGAN) 25 MG  tablet Take 25 mg by mouth every 6 (six) hours as needed. For nausea       . sulfamethoxazole-trimethoprim (BACTRIM DS) 800-160 MG per tablet Take 1 tablet by mouth 2 (two) times daily.           Intake/Output previous 24 hours: 12/28 0701 - 12/29 0700 In: 826 [I.V.:625; IV Piggyback:201] Out: 400 [Urine:400] Intake/Output previous shift: Total I/O In: -  Out: 100 [Urine:100]  Physical Exam: Vitals:  Blood pressure 126/53, pulse 81, temperature 100.8 F (38.2 C), temperature source Oral, resp. rate 20, height 5\' 6"  (1.676 m), weight 100.2 kg (220 lb 14.4 oz), SpO2 92.00%. BMI:  Body mass index is 35.65 kg/(m^2). General Appearance: well developed, well nourished in no acute distress, disheveled, looks older than stated age, obese HEENT: symmetrical face, PERRLA, edentulous Neck: no JVD, thyromegaly, or adenopathy, trachea midline Chest: symmetric without deformity Cardiac: PMI non-displaced, RRR, normal S1, S2, 2/6 systolic murmur left sternal border Lung: Decreased breath sounds throughout Vascular: Pedal pulses not palpable Abdominal: nondistended, nontender, good bowel sounds, no HSM, no bruits Extremities: no cyanosis, clubbing or edema, no sign of DVT, no varicosities  Skin: normal color, no rashes Neuro: alert and oriented x 3, non-focal Pysch: normal affect  Labs:   Lab Results  Component Value Date   WBC 17.7* 03/30/2011   HGB 10.0* 03/30/2011   HCT 29.5* 03/30/2011   MCV 92.2 03/30/2011   PLT 251 03/30/2011    Lab 03/30/11 0700  NA 128*  K 3.6  CL 91*  CO2 27  BUN 25*  CREATININE 1.36*  CALCIUM 9.6  PROT --  BILITOT --  ALKPHOS --  ALT --  AST --  GLUCOSE 237*   No results found for this basename: CKTOTAL, CKMB, CKMBINDEX, TROPONINI    No results found for this basename: CHOL   No results found for this basename: HDL   No results found for this basename: LDLCALC   No results found for this basename: TRIG   No results found for this basename:  CHOLHDL   No results found for this basename: LDLDIRECT    @RESUBNP @ No results found for this basename: TSH   BNP: No components found with this basename: POCBNP:3 D-Dimer: No results found for this basename: DDIMER:2 in the last 72 hours Hemoglobin A1C: No results found for this basename: HGBA1C in the last 72 hours No results found for  this basename: APTT,INR,PTT in the last 168 hours     Radiology: Dg Foot Complete Left  03/29/2011  *RADIOLOGY REPORT*  Clinical Data: Diabetic ulcer the base of the fifth toe.  LEFT FOOT - COMPLETE 3+ VIEW  Comparison: None.  Findings: Soft tissue swelling and soft tissue defect in the lateral aspect of the forefoot over the ankle metatarsal phalangeal region.  There is a suggestion of small gas bubbles in the subcutaneous tissues which might represent soft tissue abscess or infection.  The underlying bones appear intact without evidence of cortical erosion or sclerosis.  No obvious changes of osteomyelitis.  There is thickening of the bone cortex along the shaft of the fourth metatarsal bone which probably represents old healed fracture or stress reaction.  Mild degenerative changes in the intertarsal and tarsometatarsal joints.  Accessory navicular ossicle.  Small plantar calcaneal spur.  Vascular calcifications.  IMPRESSION: Soft tissue defect, swelling, and soft tissue gas in the lateral aspect of the left forefoot consistent with soft tissue infection. No specific evidence of osteomyelitis.  Cortical thickening along the shaft of the fourth metatarsal bone probably representing old fracture deformity or stress reaction.  Degenerative changes in the tarsal joints.  Original Report Authenticated By: Marlon Pel, M.D.   EKG: None present in the chart.  ASSESSMENT AND PLAN:   Mrs. Forbush has no symptoms of coronary artery disease or an ischemic cardiomyopathy. She most likely does have coronary disease but from a clinical standpoint would be low risk  for lower extremity surgery. I would suggest a 2-D echocardiogram to evaluate left ventricular function prior to surgery. Also obtain an EKG and she needs a chest x-ray.  Her biggest risk for surgery as her lungs. She also describes when she had a C-section of having to be intubated because of respiratory muscles failed her. This needs to be investigated further by anesthesia prior to surgery. I do not have access to outside records from that event.  Valera Castle, MD  03/30/2011, 2:17 PM

## 2011-03-30 NOTE — Progress Notes (Signed)
Subjective: Pamela [name] is an 60 y.o. female who presents to the ED with complaints of worsening redness and drainage of a wound on her left foot. She reports having an ulcer on the bottom of her left foot which has been healing slowly, but 1 week ago the top of her foot developed a blood blister that continued to worsen. She saw her podiatrist 1 week ago, but another ulcer began to develop. The redness progressed and the ulcer on the top of her left foot began to have purulent drainage. She reports having fevers off and on for the past week as well, and her blood sugars have been elevated.  She is sitting up in bed without any distress. She has no complaints. She states usually her sugars are well controlled ranging in between 150-200s.   Objective: Blood pressure 137/50, pulse 77, temperature 100.1 F (37.8 C), temperature source Oral, resp. rate 29, height 5\' 6"  (1.676 m), weight 100.2 kg (220 lb 14.4 oz), SpO2 97.00%. Weight change:   Intake/Output Summary (Last 24 hours) at 03/30/11 1016 Last data filed at 03/30/11 0851  Gross per 24 hour  Intake    826 ml  Output    500 ml  Net    326 ml    Physical Exam: General appearance: alert, cooperative and no distress Head: Normocephalic, without obvious abnormality, atraumatic Eyes: conjunctivae/corneas clear. PERRL, EOM's intact.  Throat: lips, mucosa, and tongue normal Lungs: clear to auscultation bilaterally Heart: regular rate and rhythm, S1, S2 normal, no murmur, click, rub or gallop Abdomen: soft, non-tender; bowel sounds normal; no masses,  no organomegaly Extremities: extremities normal, atraumatic, no cyanosis or edema Skin: Skin color, texture, turgor normal. No rashes or lesions or ulcer present on lateral planter aspect of left foot with gangrenous changes noted adjacent to it and in the 5th toe. Purulent discharge is present.  Neurologic: Grossly normal  Lab Results:  Basename 03/30/11 0700 03/29/11 2259  NA 128* 120*    K 3.6 4.2  CL 91* 83*  CO2 27 22  GLUCOSE 237* 782*  BUN 25* 25*  CREATININE 1.36* 1.53*  CALCIUM 9.6 9.3  MG -- --  PHOS -- --   No results found for this basename: AST:2,ALT:2,ALKPHOS:2,BILITOT:2,PROT:2,ALBUMIN:2 in the last 72 hours No results found for this basename: LIPASE:2,AMYLASE:2 in the last 72 hours  Basename 03/30/11 0700 03/29/11 2259  WBC 17.7* 20.5*  NEUTROABS -- 15.7*  HGB 10.0* 10.8*  HCT 29.5* 32.6*  MCV 92.2 95.6  PLT 251 270   No results found for this basename: CKTOTAL:3,CKMB:3,CKMBINDEX:3,TROPONINI:3 in the last 72 hours No components found with this basename: POCBNP:3 No results found for this basename: DDIMER:2 in the last 72 hours No results found for this basename: HGBA1C:2 in the last 72 hours No results found for this basename: CHOL:2,HDL:2,LDLCALC:2,TRIG:2,CHOLHDL:2,LDLDIRECT:2 in the last 72 hours No results found for this basename: TSH,T4TOTAL,FREET3,T3FREE,THYROIDAB in the last 72 hours No results found for this basename: VITAMINB12:2,FOLATE:2,FERRITIN:2,TIBC:2,IRON:2,RETICCTPCT:2 in the last 72 hours  Micro Results: Recent Results (from the past 240 hour(s))  MRSA PCR SCREENING     Status: Normal   Collection Time   03/30/11  3:07 AM      Component Value Range Status Comment   MRSA by PCR NEGATIVE  NEGATIVE  Final     Studies/Results: Dg Foot Complete Left  03/29/2011  *RADIOLOGY REPORT*  Clinical Data: Diabetic ulcer the base of the fifth toe.  LEFT FOOT - COMPLETE 3+ VIEW  Comparison: None.  Findings: Soft  tissue swelling and soft tissue defect in the lateral aspect of the forefoot over the ankle metatarsal phalangeal region.  There is a suggestion of small gas bubbles in the subcutaneous tissues which might represent soft tissue abscess or infection.  The underlying bones appear intact without evidence of cortical erosion or sclerosis.  No obvious changes of osteomyelitis.  There is thickening of the bone cortex along the shaft of the  fourth metatarsal bone which probably represents old healed fracture or stress reaction.  Mild degenerative changes in the intertarsal and tarsometatarsal joints.  Accessory navicular ossicle.  Small plantar calcaneal spur.  Vascular calcifications.  IMPRESSION: Soft tissue defect, swelling, and soft tissue gas in the lateral aspect of the left forefoot consistent with soft tissue infection. No specific evidence of osteomyelitis.  Cortical thickening along the shaft of the fourth metatarsal bone probably representing old fracture deformity or stress reaction.  Degenerative changes in the tarsal joints.  Original Report Authenticated By: Marlon Pel, M.D.    Medications: Scheduled Meds:   . allopurinol  300 mg Oral Daily  . amLODipine  10 mg Oral Daily  . enoxaparin  40 mg Subcutaneous Daily  . gabapentin  800 mg Oral TID  .  HYDROmorphone (DILAUDID) injection  1 mg Intravenous Once  . insulin aspart  0-15 Units Subcutaneous TID WC  . insulin aspart  0-5 Units Subcutaneous QHS  . insulin glargine  60 Units Subcutaneous BID  . insulin (NOVOLIN-R) infusion   Intravenous To Major  . insulin regular  0-10 Units Intravenous TID WC  . ondansetron  4 mg Intravenous Once  . piperacillin-tazobactam (ZOSYN)  IV  3.375 g Intravenous Once  . piperacillin-tazobactam (ZOSYN)  IV  3.375 g Intravenous Q8H  . vancomycin  1,500 mg Intravenous Q24H  . vancomycin  1,000 mg Intravenous Once  . DISCONTD: insulin regular  0-10 Units Intravenous TID WC  . DISCONTD: insulin regular  0-10 Units Intravenous TID WC  . DISCONTD: vancomycin  1,000 mg Intravenous Q12H   Continuous Infusions:   . sodium chloride    . DISCONTD: sodium chloride    . DISCONTD: sodium chloride    . DISCONTD: dextrose 5 % and 0.45% NaCl    . DISCONTD: dextrose 5 % and 0.45% NaCl 125 mL/hr at 03/30/11 0258  . DISCONTD: dextrose 5 % and 0.45% NaCl    . DISCONTD: insulin (NOVOLIN-R) infusion 8.4 Units/hr (03/30/11 0643)  . DISCONTD:  insulin (NOVOLIN-R) infusion     PRN Meds:.acetaminophen, acetaminophen, alum & mag hydroxide-simeth, dextrose, HYDROmorphone, ondansetron (ZOFRAN) IV, ondansetron, oxyCODONE, zolpidem, DISCONTD: dextrose  Assessment/Plan: Principal Problem:  *Diabetic foot ulcer/ leukocytosis/ fevers - being managed with Zosyn and VAncomycin. Appreciate vascular surgery eval. Pt will need and angio prior to debridement.  Dr Darrick Penna requesting a stress test prior to procedures. Patient has no complaints of chest pain recently. She has no history of cardiac disease. I will order an EKG and will request a cardiac eval for clearance.   Active Problems:  Diabetes mellitus- sugars noted to be extremely uncontrolled on admission although the patient states usually they are quite well controlled. As her sugars have improved on the insulin drip, I will now d/c it and resume her home dose of Lantus. Hopefully with better controlling the infection, sugars will improve. I will check an A1c.      Hyponatremia- likely from dehydration secondary to uncontrolled sugars. The HCTZ that she takes is probably contributing as well.  We will stop d5 and  continue her on NS while watching sodium levels.    HTN (hypertension)- resuming Norvasc. Hold ARB and HCTZ for now.   GERD (gastroesophageal reflux disease)  Gout  CKD (chronic kidney disease) stage 3, GFR 30-59 ml/min- no baseline creatinine to compare this with.  Obesity, morbid  LOS: 1 day   [facility] [phone] 03/30/2011, 10:16 AM

## 2011-03-31 DIAGNOSIS — I517 Cardiomegaly: Secondary | ICD-10-CM

## 2011-03-31 LAB — CBC
HCT: 28.7 % — ABNORMAL LOW (ref 36.0–46.0)
MCH: 30.8 pg (ref 26.0–34.0)
MCHC: 32.8 g/dL (ref 30.0–36.0)
MCV: 94.1 fL (ref 78.0–100.0)
RDW: 13.2 % (ref 11.5–15.5)
WBC: 15.7 10*3/uL — ABNORMAL HIGH (ref 4.0–10.5)

## 2011-03-31 LAB — BASIC METABOLIC PANEL
BUN: 21 mg/dL (ref 6–23)
CO2: 25 mEq/L (ref 19–32)
Chloride: 95 mEq/L — ABNORMAL LOW (ref 96–112)
Creatinine, Ser: 1.43 mg/dL — ABNORMAL HIGH (ref 0.50–1.10)
Glucose, Bld: 204 mg/dL — ABNORMAL HIGH (ref 70–99)

## 2011-03-31 LAB — GLUCOSE, CAPILLARY
Glucose-Capillary: 278 mg/dL — ABNORMAL HIGH (ref 70–99)
Glucose-Capillary: 325 mg/dL — ABNORMAL HIGH (ref 70–99)

## 2011-03-31 MED ORDER — INSULIN GLARGINE 100 UNIT/ML ~~LOC~~ SOLN
60.0000 [IU] | Freq: Two times a day (BID) | SUBCUTANEOUS | Status: DC
  Administered 2011-03-31 – 2011-04-08 (×15): 60 [IU] via SUBCUTANEOUS
  Filled 2011-03-31 (×3): qty 3

## 2011-03-31 MED ORDER — SODIUM CHLORIDE 0.9 % IV SOLN
INTRAVENOUS | Status: DC
  Administered 2011-03-31 – 2011-04-01 (×2): via INTRAVENOUS

## 2011-03-31 MED ORDER — INSULIN GLARGINE 100 UNIT/ML ~~LOC~~ SOLN: 70.0000 [IU] | Freq: Two times a day (BID) | SUBCUTANEOUS | Status: DC

## 2011-03-31 MED ORDER — INSULIN GLARGINE 100 UNIT/ML ~~LOC~~ SOLN: 65.0000 [IU] | Freq: Two times a day (BID) | SUBCUTANEOUS | Status: DC

## 2011-03-31 NOTE — Progress Notes (Signed)
Subjective: Pamela [name] is an 60 y.o. female who presents to the ED with complaints of worsening redness and drainage of a wound on her left foot. She reports having an ulcer on the bottom of her left foot which has been healing slowly, but 1 week ago the top of her foot developed a blood blister that continued to worsen. She saw her podiatrist 1 week ago, but another ulcer began to develop. The redness progressed and the ulcer on the top of her left foot began to have purulent drainage. She reports having fevers off and on for the past week as well, and her blood sugars have been elevated.  She has no complaints today. Pain in her foot is controlled with medications. Per nursing she is eating well. She continues to have a flat affect.   Objective: Blood pressure 139/52, pulse 72, temperature 99.1 F (37.3 C), temperature source Oral, resp. rate 18, height 5\' 6"  (1.676 m), weight 100.2 kg (220 lb 14.4 oz), SpO2 99.00%. Weight change:   Intake/Output Summary (Last 24 hours) at 03/31/11 1338 Last data filed at 03/31/11 1100  Gross per 24 hour  Intake 2036.67 ml  Output    200 ml  Net 1836.67 ml    Physical Exam: General appearance: alert, cooperative and no distress Head: Normocephalic, without obvious abnormality, atraumatic Eyes: conjunctivae/corneas clear. PERRL, EOM's intact.  Throat: lips, mucosa, and tongue normal Lungs: clear to auscultation bilaterally Heart: regular rate and rhythm, S1, S2 normal, no murmur, click, rub or gallop Abdomen: soft, non-tender; bowel sounds normal; no masses,  no organomegaly Extremities: extremities normal, atraumatic, no cyanosis or edema Skin: Skin color, texture, turgor normal. No rashes or lesions or ulcer present on lateral planter aspect of left foot with gangrenous changes noted adjacent to it and in the 5th toe. Purulent discharge is present.  Neurologic: Grossly normal  Lab Results:  Basename 03/31/11 0525 03/30/11 0700  NA 127* 128*    K 4.0 3.6  CL 95* 91*  CO2 25 27  GLUCOSE 204* 237*  BUN 21 25*  CREATININE 1.43* 1.36*  CALCIUM 8.9 9.6  MG -- --  PHOS -- --   No results found for this basename: AST:2,ALT:2,ALKPHOS:2,BILITOT:2,PROT:2,ALBUMIN:2 in the last 72 hours No results found for this basename: LIPASE:2,AMYLASE:2 in the last 72 hours  Basename 03/31/11 0525 03/30/11 0700 03/29/11 2259  WBC 15.7* 17.7* --  NEUTROABS -- -- 15.7*  HGB 9.4* 10.0* --  HCT 28.7* 29.5* --  MCV 94.1 92.2 --  PLT 243 251 --   No results found for this basename: CKTOTAL:3,CKMB:3,CKMBINDEX:3,TROPONINI:3 in the last 72 hours No components found with this basename: POCBNP:3 No results found for this basename: DDIMER:2 in the last 72 hours No results found for this basename: HGBA1C:2 in the last 72 hours No results found for this basename: CHOL:2,HDL:2,LDLCALC:2,TRIG:2,CHOLHDL:2,LDLDIRECT:2 in the last 72 hours No results found for this basename: TSH,T4TOTAL,FREET3,T3FREE,THYROIDAB in the last 72 hours No results found for this basename: VITAMINB12:2,FOLATE:2,FERRITIN:2,TIBC:2,IRON:2,RETICCTPCT:2 in the last 72 hours  Micro Results: Recent Results (from the past 240 hour(s))  MRSA PCR SCREENING     Status: Normal   Collection Time   03/30/11  3:07 AM      Component Value Range Status Comment   MRSA by PCR NEGATIVE  NEGATIVE  Final     Studies/Results: Dg Foot Complete Left  03/29/2011  *RADIOLOGY REPORT*  Clinical Data: Diabetic ulcer the base of the fifth toe.  LEFT FOOT - COMPLETE 3+ VIEW  Comparison: None.  Findings: Soft tissue swelling and soft tissue defect in the lateral aspect of the forefoot over the ankle metatarsal phalangeal region.  There is a suggestion of small gas bubbles in the subcutaneous tissues which might represent soft tissue abscess or infection.  The underlying bones appear intact without evidence of cortical erosion or sclerosis.  No obvious changes of osteomyelitis.  There is thickening of the bone  cortex along the shaft of the fourth metatarsal bone which probably represents old healed fracture or stress reaction.  Mild degenerative changes in the intertarsal and tarsometatarsal joints.  Accessory navicular ossicle.  Small plantar calcaneal spur.  Vascular calcifications.  IMPRESSION: Soft tissue defect, swelling, and soft tissue gas in the lateral aspect of the left forefoot consistent with soft tissue infection. No specific evidence of osteomyelitis.  Cortical thickening along the shaft of the fourth metatarsal bone probably representing old fracture deformity or stress reaction.  Degenerative changes in the tarsal joints.  Original Report Authenticated By: Marlon Pel, M.D.    Medications: Scheduled Meds:    . allopurinol  300 mg Oral Daily  . amLODipine  10 mg Oral Daily  . collagenase   Topical BID  . enoxaparin  40 mg Subcutaneous Daily  . gabapentin  800 mg Oral TID  . insulin aspart  0-15 Units Subcutaneous TID WC  . insulin aspart  0-5 Units Subcutaneous QHS  . insulin glargine  65 Units Subcutaneous BID  . insulin regular  0-10 Units Intravenous TID WC  . piperacillin-tazobactam (ZOSYN)  IV  3.375 g Intravenous Q8H  . vancomycin  1,500 mg Intravenous Q24H  . DISCONTD: insulin glargine  60 Units Subcutaneous BID  . DISCONTD: insulin glargine  70 Units Subcutaneous BID   Continuous Infusions:    . sodium chloride 100 mL/hr at 03/31/11 1200   PRN Meds:.acetaminophen, acetaminophen, alum & mag hydroxide-simeth, dextrose, HYDROmorphone, ondansetron (ZOFRAN) IV, ondansetron, oxyCODONE, zolpidem  Assessment/Plan: Principal Problem:  *Diabetic foot ulcer/ leukocytosis/ fevers - being managed with Zosyn and VAncomycin. Appreciate vascular surgery eval. Pt will need an angio prior to a possible amputation.  Dr Darrick Penna requesting a stress test prior to procedures. Patient has no complaints of chest pain recently. She has no history of cardiac disease. I have requesed a  cardiac eval for clearance. An ECHO has been ordered and is pending. The EKG is unremarkable.   Active Problems:  Diabetes mellitus- sugars noted to be extremely uncontrolled on admission although the patient states usually they are quite well controlled.  Sugars still high today - Since she will be NPO for an Angio tomorrow, I will not be increasing her insulin today.   Hopefully with better controlling the infection, sugars will improve.     Hyponatremia- likely from dehydration secondary to uncontrolled sugars. The HCTZ that she takes is probably contributing as well.  I have stopped D5 and am continuing NS and following sodium levels.    HTN (hypertension)- resumed Norvasc. Hold ARB and HCTZ for now.   GERD (gastroesophageal reflux disease)  Gout  CKD (chronic kidney disease) stage 3, GFR 30-59 ml/min- no baseline creatinine to compare this with.  Obesity, morbid  LOS: 2 days   [facility] [phone] 03/31/2011, 1:38 PM

## 2011-03-31 NOTE — Progress Notes (Signed)
Patient ID: Pamela [name], female   DOB: Jun 09, 1950, 60 y.o.   MRN: [id number]   Dr. Vern Claude note reviewed.  ECG is unremarkable. Awaiting echo.  No new recommendations.   [name] 03/31/2011 10:22 AM

## 2011-03-31 NOTE — Progress Notes (Signed)
[facility]  Subjective  - No complaints  Objective 140/63 82 99.2 F (37.3 C) (Oral) 19 95%  Intake/Output Summary (Last 24 hours) at 03/31/11 1018 Last data filed at 03/31/11 0500  Gross per 24 hour  Intake 2396.67 ml  Output      0 ml  Net 2396.67 ml   Left foot unchanged with early gangrene left 5th toe plantar ulcer 2 cm fairly clean Necrotic eschar dorsal 5th metatarsal erythema extending from foot into distal leg  Assessment/Planning: Marginal left foot  Arteriogram with possible intervention by Dr Dickson tomorrow. If unable to do percutaneous revascularization tomorrow will need bypass or amputation later this week.  [name],[name] 03/31/2011 10:18 AM --  Laboratory Lab Results:  Basename 03/31/11 0525 03/30/11 0700  WBC 15.7* 17.7*  HGB 9.4* 10.0*  HCT 28.7* 29.5*  PLT 243 251   BMET  Basename 03/31/11 0525 03/30/11 0700  NA 127* 128*  K 4.0 3.6  CL 95* 91*  CO2 25 27  GLUCOSE 204* 237*  BUN 21 25*  CREATININE 1.43* 1.36*  CALCIUM 8.9 9.6    COAG No results found for this basename: INR, PROTIME   No results found for this basename: PTT    Antibiotics Anti-infectives     Start     Dose/Rate Route Frequency Ordered Stop   03/30/11 2100   vancomycin (VANCOCIN) 1,500 mg in sodium chloride 0.9 % 500 mL IVPB        1,500 mg 250 mL/hr over 120 Minutes Intravenous Every 24 hours 03/30/11 0821     03/30/11 0600   vancomycin (VANCOCIN) IVPB 1000 mg/200 mL premix  Status:  Discontinued        1,000 mg 200 mL/hr over 60 Minutes Intravenous Every 12 hours 03/30/11 0224 03/30/11 0818   03/30/11 0600  piperacillin-tazobactam (ZOSYN) IVPB 3.375 g       3.375 g 12.5 mL/hr over 240 Minutes Intravenous 3 times per day 03/30/11 0224     03/29/11 2300   vancomycin (VANCOCIN) IVPB 1000 mg/200 mL premix        1,000 mg 200 mL/hr over 60 Minutes Intravenous  Once 03/29/11 2246 03/30/11 0023   03/29/11 2300   piperacillin-tazobactam (ZOSYN) IVPB 3.375 g       3.375 g 12.5 mL/hr over 240 Minutes Intravenous  Once 03/29/11 2246 03/30/11 0022             

## 2011-03-31 NOTE — Progress Notes (Signed)
*  PRELIMINARY RESULTS* Echocardiogram 2D Echocardiogram has been performed.  Pamela [name] [facility] 03/31/2011, 8:42 AM

## 2011-04-01 ENCOUNTER — Encounter (HOSPITAL_COMMUNITY)
Admission: EM | Disposition: A | Discharge status: Skilled Nursing Facility | Payer: Self-pay | Source: Home / Self Care | Attending: Internal Medicine

## 2011-04-01 DIAGNOSIS — I739 Peripheral vascular disease, unspecified: Secondary | ICD-10-CM | POA: Diagnosis present

## 2011-04-01 DIAGNOSIS — I10 Essential (primary) hypertension: Secondary | ICD-10-CM

## 2011-04-01 HISTORY — PX: ABDOMINAL ANGIOGRAM: SHX5499

## 2011-04-01 HISTORY — PX: LOWER EXTREMITY ANGIOGRAM: SHX5508

## 2011-04-01 LAB — GLUCOSE, CAPILLARY
Glucose-Capillary: 269 mg/dL — ABNORMAL HIGH (ref 70–99)
Glucose-Capillary: 185 mg/dL — ABNORMAL HIGH (ref 70–99)

## 2011-04-01 LAB — CBC
MCV: 95.1 fL (ref 78.0–100.0)
Platelets: 246 10*3/uL (ref 150–400)
RBC: 3.08 MIL/uL — ABNORMAL LOW (ref 3.87–5.11)
WBC: 10.6 10*3/uL — ABNORMAL HIGH (ref 4.0–10.5)

## 2011-04-01 LAB — BASIC METABOLIC PANEL
BUN: 15 mg/dL (ref 6–23)
Chloride: 102 mEq/L (ref 96–112)
GFR calc non Af Amer: 45 mL/min — ABNORMAL LOW (ref >90)
Glucose, Bld: 110 mg/dL — ABNORMAL HIGH (ref 70–99)
Potassium: 4.1 mEq/L (ref 3.5–5.1)

## 2011-04-01 SURGERY — ABDOMINAL ANGIOGRAM
Anesthesia: LOCAL

## 2011-04-01 MED ORDER — VITAMIN B-12 100 MCG PO TABS
100.0000 ug | ORAL_TABLET | Freq: Every day | ORAL | Status: DC
  Administered 2011-04-01 – 2011-04-03 (×3): 100 ug via ORAL
  Filled 2011-04-01 (×4): qty 1

## 2011-04-01 MED ORDER — ONDANSETRON HCL 4 MG/2ML IJ SOLN: 4.0000 mg | Freq: Four times a day (QID) | INTRAMUSCULAR | Status: DC | PRN

## 2011-04-01 MED ORDER — LIDOCAINE HCL (PF) 1 % IJ SOLN
INTRAMUSCULAR | Status: AC
  Filled 2011-04-01: qty 30

## 2011-04-01 MED ORDER — HEPARIN (PORCINE) IN NACL 2-0.9 UNIT/ML-% IJ SOLN
INTRAMUSCULAR | Status: AC
  Filled 2011-04-01: qty 1000

## 2011-04-01 MED ORDER — SODIUM CHLORIDE 0.9 % IV SOLN
INTRAVENOUS | Status: DC
  Administered 2011-04-01: 12:00:00 via INTRAVENOUS

## 2011-04-01 MED ORDER — ACETAMINOPHEN 325 MG PO TABS: 650.0000 mg | ORAL_TABLET | ORAL | Status: DC | PRN

## 2011-04-01 MED ORDER — LOSARTAN POTASSIUM 50 MG PO TABS
100.0000 mg | ORAL_TABLET | Freq: Every day | ORAL | Status: DC
  Administered 2011-04-01 – 2011-04-03 (×3): 100 mg via ORAL
  Filled 2011-04-01 (×4): qty 2

## 2011-04-01 MED ORDER — SULFAMETHOXAZOLE-TMP DS 800-160 MG PO TABS: 1.0000 | ORAL_TABLET | Freq: Two times a day (BID) | ORAL | Status: DC

## 2011-04-01 MED ORDER — OMEGA-3-ACID ETHYL ESTERS 1 G PO CAPS
4.0000 g | ORAL_CAPSULE | Freq: Every day | ORAL | Status: DC
  Administered 2011-04-01 – 2011-04-03 (×3): 4 g via ORAL
  Filled 2011-04-01 (×4): qty 4

## 2011-04-01 MED ORDER — INSULIN ASPART 100 UNIT/ML ~~LOC~~ SOLN: 12.0000 [IU] | Freq: Two times a day (BID) | SUBCUTANEOUS | Status: DC

## 2011-04-01 MED ORDER — VITAMIN D (ERGOCALCIFEROL) 1.25 MG (50000 UNIT) PO CAPS
50000.0000 [IU] | ORAL_CAPSULE | ORAL | Status: DC
  Administered 2011-04-01: 50000 [IU] via ORAL
  Filled 2011-04-01: qty 1

## 2011-04-01 MED ORDER — SIMVASTATIN 20 MG PO TABS
20.0000 mg | ORAL_TABLET | Freq: Every day | ORAL | Status: DC
  Administered 2011-04-01 – 2011-04-03 (×3): 20 mg via ORAL
  Filled 2011-04-01 (×4): qty 1

## 2011-04-01 MED ORDER — FENTANYL CITRATE 0.05 MG/ML IJ SOLN
INTRAMUSCULAR | Status: AC
  Filled 2011-04-01: qty 2

## 2011-04-01 MED ORDER — ASPIRIN EC 81 MG PO TBEC
81.0000 mg | DELAYED_RELEASE_TABLET | Freq: Every day | ORAL | Status: DC
  Administered 2011-04-02 – 2011-04-12 (×10): 81 mg via ORAL
  Filled 2011-04-01 (×11): qty 1

## 2011-04-01 MED ORDER — CYCLOBENZAPRINE HCL 10 MG PO TABS
10.0000 mg | ORAL_TABLET | Freq: Three times a day (TID) | ORAL | Status: DC
  Administered 2011-04-01 – 2011-04-03 (×9): 10 mg via ORAL
  Filled 2011-04-01 (×12): qty 1

## 2011-04-01 MED ORDER — ENOXAPARIN SODIUM 40 MG/0.4ML ~~LOC~~ SOLN: 40.0000 mg | SUBCUTANEOUS | Status: DC

## 2011-04-01 MED ORDER — HYDROCHLOROTHIAZIDE 25 MG PO TABS
25.0000 mg | ORAL_TABLET | Freq: Every day | ORAL | Status: DC
  Administered 2011-04-01: 25 mg via ORAL
  Filled 2011-04-01: qty 1

## 2011-04-01 MED ORDER — MIDAZOLAM HCL 2 MG/2ML IJ SOLN
INTRAMUSCULAR | Status: AC
  Filled 2011-04-01: qty 2

## 2011-04-01 MED ORDER — IMIPENEM-CILASTATIN 500 MG IV SOLR
500.0000 mg | Freq: Three times a day (TID) | INTRAVENOUS | Status: DC
  Administered 2011-04-01 – 2011-04-11 (×28): 500 mg via INTRAVENOUS
  Filled 2011-04-01 (×33): qty 500

## 2011-04-01 MED ORDER — PROMETHAZINE HCL 25 MG PO TABS: 25.0000 mg | ORAL_TABLET | Freq: Four times a day (QID) | ORAL | Status: DC | PRN

## 2011-04-01 MED ORDER — HYDROXYZINE HCL 25 MG PO TABS
25.0000 mg | ORAL_TABLET | Freq: Four times a day (QID) | ORAL | Status: DC | PRN
  Filled 2011-04-01: qty 1

## 2011-04-01 NOTE — H&P (View-Only) (Signed)
[facility] [place]  Subjective  - No complaints  Objective 140/63 82 99.2 F (37.3 C) (Oral) 19 95%  Intake/Output Summary (Last 24 hours) at 03/31/11 1018 Last data filed at 03/31/11 0500  Gross per 24 hour  Intake 2396.67 ml  Output      0 ml  Net 2396.67 ml   Left foot unchanged with early gangrene left 5th toe plantar ulcer 2 cm fairly clean Necrotic eschar dorsal 5th metatarsal erythema extending from foot into distal leg  Assessment/Planning: Marginal left foot  Arteriogram with possible intervention by Dr Edilia Bo tomorrow. If unable to do percutaneous revascularization tomorrow will need bypass or amputation later this week.  Janiel Derhammer E 03/31/2011 10:18 AM --  Laboratory Lab Results:  Basename 03/31/11 0525 03/30/11 0700  WBC 15.7* 17.7*  HGB 9.4* 10.0*  HCT 28.7* 29.5*  PLT 243 251   BMET  Basename 03/31/11 0525 03/30/11 0700  NA 127* 128*  K 4.0 3.6  CL 95* 91*  CO2 25 27  GLUCOSE 204* 237*  BUN 21 25*  CREATININE 1.43* 1.36*  CALCIUM 8.9 9.6    COAG No results found for this basename: INR, PROTIME   No results found for this basename: PTT    Antibiotics Anti-infectives     Start     Dose/Rate Route Frequency Ordered Stop   03/30/11 2100   vancomycin (VANCOCIN) 1,500 mg in sodium chloride 0.9 % 500 mL IVPB        1,500 mg 250 mL/hr over 120 Minutes Intravenous Every 24 hours 03/30/11 0821     03/30/11 0600   vancomycin (VANCOCIN) IVPB 1000 mg/200 mL premix  Status:  Discontinued        1,000 mg 200 mL/hr over 60 Minutes Intravenous Every 12 hours 03/30/11 0224 03/30/11 0818   03/30/11 0600  piperacillin-tazobactam (ZOSYN) IVPB 3.375 g       3.375 g 12.5 mL/hr over 240 Minutes Intravenous 3 times per day 03/30/11 0224     03/29/11 2300   vancomycin (VANCOCIN) IVPB 1000 mg/200 mL premix        1,000 mg 200 mL/hr over 60 Minutes Intravenous  Once 03/29/11 2246 03/30/11 0023   03/29/11 2300   piperacillin-tazobactam (ZOSYN) IVPB 3.375 g       3.375 g 12.5 mL/hr over 240 Minutes Intravenous  Once 03/29/11 2246 03/30/11 0022

## 2011-04-01 NOTE — Progress Notes (Signed)
Subjective: Resting after angiogram this AM. No chest pain or dyspnea at rest.   Objective: Temp:  [98 F (36.7 C)-101.4 F (38.6 C)] 98 F (36.7 C) (12/31 0734) Pulse Rate:  [57-82] 72  (12/31 0809) Resp:  [12-24] 14  (12/31 0734) BP: (100-139)/(50-80) 123/60 mmHg (12/31 0734) SpO2:  [92 %-99 %] 98 % (12/31 0338) Weight:  [235 lb 14.3 oz (107 kg)] 235 lb 14.3 oz (107 kg) (12/31 0338)  I/O last 3 completed shifts: In: 3037.5 [I.V.:2300; IV Piggyback:737.5] Out: 660 [Urine:660]  Telemetry - Sinus rhythm.  Exam -   General - NAD.  Lungs - Clear anteriorly.  Cardiac - RRR, no S3.  Extremities - Left foot dressed, right groin dressed.  Testing -   Lab Results  Component Value Date   WBC 15.7* 03/31/2011   HGB 9.4* 03/31/2011   HCT 28.7* 03/31/2011   MCV 94.1 03/31/2011   PLT 243 03/31/2011    Lab Results  Component Value Date   CREATININE 1.43* 03/31/2011   BUN 21 03/31/2011   NA 127* 03/31/2011   K 4.0 03/31/2011   CL 95* 03/31/2011   CO2 25 03/31/2011    No results found for this basename: CKTOTAL, CKMB, CKMBINDEX, TROPONINI   Echocardiogram 12/30: - Left ventricle: The cavity size was normal. Wall thickness was increased in a pattern of mild LVH. Systolic function was normal. The estimated ejection fraction was in the range of 55% to 60%. Wall motion was normal; there were no regional wall motion abnormalities. Features are consistent with a pseudonormal left ventricular filling pattern, with concomitant abnormal relaxation and increased filling pressure (grade 2 diastolic dysfunction). - Mitral valve: Trivial regurgitation. - Left atrium: The atrium was mildly dilated. - Right ventricle: The cavity size was normal. Systolic function was normal. - Right atrium: The atrium was mildly dilated. - Pulmonary arteries: No complete TR doppler jet so unable to estimate PA systolic pressure. - Systemic veins: IVC is dilated to 3 cm with some respirophasic  variation, suggesting RA pressure at least 15 mmHg.   Current Medications    . allopurinol  300 mg Oral Daily  . amLODipine  10 mg Oral Daily  . collagenase   Topical BID  . cyclobenzaprine  10 mg Oral TID  . enoxaparin  40 mg Subcutaneous Daily  . ergocalciferol  50,000 Units Oral Weekly  . fentaNYL      . gabapentin  800 mg Oral TID  . heparin      . hydrochlorothiazide  25 mg Oral Daily  . insulin aspart  0-15 Units Subcutaneous TID WC  . insulin aspart  0-5 Units Subcutaneous QHS  . insulin aspart  12-30 Units Subcutaneous BID  . insulin glargine  60 Units Subcutaneous BID  . lidocaine      . losartan  100 mg Oral Daily  . midazolam      . omega-3 acid ethyl esters  4 g Oral Daily  . piperacillin-tazobactam (ZOSYN)  IV  3.375 g Intravenous Q8H  . simvastatin  20 mg Oral Daily  . vancomycin  1,500 mg Intravenous Q24H  . vitamin B-12  100 mcg Oral Daily  . DISCONTD: insulin glargine  60 Units Subcutaneous BID  . DISCONTD: insulin glargine  65 Units Subcutaneous BID  . DISCONTD: insulin glargine  70 Units Subcutaneous BID  . DISCONTD: insulin regular  0-10 Units Intravenous TID WC  . DISCONTD: sulfamethoxazole-trimethoprim  1 tablet Oral BID     Assessment:  1. Perioperative cardiac  evaluation in setting of PAD. LVEF is normal by echocardiogram. ECG nonspecific with small R primes V1 and V2. No active chest pain or CHF symptoms. Suggest continue medical therapy and observation for now.  2. Type 2 diabetes mellitus, per IM team.  3. Mild renal insufficiency, followup BMET.  4. PAD, evaluation per Dr. Edilia Bo.  Plan:  On Zocor, Cozaar, Norvasc, Lovenox, omega-3. Add ASA 81 mg daily. Check BMET and FLP.  Jonelle Sidle, M.D., F.A.C.C.

## 2011-04-01 NOTE — Progress Notes (Signed)
VASCULAR PROGRESS NOTE  SUBJECTIVE: No specific complaints.  PHYSICAL EXAM: Filed Vitals:   03/31/11 2318 04/01/11 0338 04/01/11 0734 04/01/11 0809  BP: 104/80 100/50 123/60   Pulse: 75 57  72  Temp: 101.4 F (38.6 C) 98.9 F (37.2 C) 98 F (36.7 C)   TempSrc: Oral Oral Oral   Resp: 24 12 14    Height:      Weight:  235 lb 14.3 oz (107 kg)    SpO2: 92% 98%     Lungs: clear to auscultation Gangrene of left fifth toe with cellulitis on dorsum of foot in plantar aspect of foot.  LABS: Lab Results  Component Value Date   WBC 15.7* 03/31/2011   HGB 9.4* 03/31/2011   HCT 28.7* 03/31/2011   MCV 94.1 03/31/2011   PLT 243 03/31/2011   Lab Results  Component Value Date   CREATININE 1.25* 04/01/2011   No results found for this basename: INR, PROTIME   CBG (last 3)   Basename 03/31/11 2149 03/31/11 1727 03/31/11 1123  GLUCAP 269* 262* 325*   Arteriogram today shows a superficial femoral artery occlusion on the left with reconstitution of the distal popliteal artery and 3 vessel runoff.  ASSESSMENT/PLAN: I believe that her only chance for limb salvage with the left femoropopliteal bypass grafting and amputation of the fifth toe. I've explained that she can also require amputation of the adjacent fourth toe or even the entire forefoot depending upon the extent of the infection. I think without revascularization that this wound would not heal. Therefore, I have recommended left femoropopliteal bypass grafting with left fifth toe amputation, possible fourth toe amputation, and possible forefoot amputation. I have ordered preoperative vein mapping of the left greater saphenous vein today. As soon as I can schedule her surgery is Thursday. Dr. Darrick Penna he is out of town.   I have reviewed the indications for lower extremity bypass. I have also reviewed the potential complications of surgery including but not limited to: wound healing problems, infection, graft thrombosis, limb loss, or  other unpredictable medical problems. She understands that she is at increased risk for wound healing problems and infection because of her obesity and diabetes. All the patient's questions were answered and they are agreeable to proceed.  Pamela Ferrari, MD, FACS Beeper: [phone] 04/01/2011

## 2011-04-01 NOTE — Progress Notes (Signed)
Inpatient Diabetes Program Recommendations  AACE/ADA: New Consensus Statement on Inpatient Glycemic Control (2009)  Target Ranges:  Prepandial:   less than 140 mg/dL      Peak postprandial:   less than 180 mg/dL (1-2 hours)      Critically ill patients:  140 - 180 mg/dL   Reason for Visit: Diabetic foot ulcer and hyperlgycemia of 782 mg/dL on admission  Inpatient Diabetes Program Recommendations HgbA1C: Please order A1C to establish control prior to hospitalization.  Pt may need med addition and/or adjustment.  Note: Thank you, Lenor Coffin, RN, CNS, Diabetes Coordinator [phone])

## 2011-04-01 NOTE — Progress Notes (Signed)
Subjective: This is a 60 y/o female with diabetes who was admitted for DKA and an infected left foot ulcer with gangrenous changes of the the 5th toe  She if feeling cold and is noted to be having chills. She had a fever of 101.4 last night.   Objective: Blood pressure 156/86, pulse 76, temperature 98.1 F (36.7 C), temperature source Oral, resp. rate 19, height 5\' 6"  (1.676 m), weight 107 kg (235 lb 14.3 oz), SpO2 96.00%. Weight change:   Intake/Output Summary (Last 24 hours) at 04/01/11 1601 Last data filed at 04/01/11 1200  Gross per 24 hour  Intake 2432.5 ml  Output    460 ml  Net 1972.5 ml    Physical Exam: General appearance: alert, cooperative and no distress Head: Normocephalic, without obvious abnormality, atraumatic Eyes: conjunctivae/corneas clear. PERRL, EOM's intact.  Throat: lips, mucosa, and tongue normal Lungs: clear to auscultation bilaterally Heart: regular rate and rhythm, S1, S2 normal, no murmur, click, rub or gallop Abdomen: soft, non-tender; bowel sounds normal; no masses,  no organomegaly Extremities: extremities normal, atraumatic, no cyanosis or edema Skin: Skin color, texture, turgor normal. No rashes or lesions or ulcer present on lateral planter aspect of left foot with gangrenous changes noted adjacent to it and in the 5th toe. Purulent discharge is present.  Neurologic: Grossly normal  Lab Results:  [facility] 04/01/11 0959 03/31/11 0525  NA 135 127*  K 4.1 4.0  CL 102 95*  CO2 23 25  GLUCOSE 110* 204*  BUN 15 21  CREATININE 1.25* 1.43*  CALCIUM 8.6 8.9  MG -- --  PHOS -- --   No results found for this basename: AST:2,ALT:2,ALKPHOS:2,BILITOT:2,PROT:2,ALBUMIN:2 in the last 72 hours No results found for this basename: LIPASE:2,AMYLASE:2 in the last 72 hours  Basename 04/01/11 0959 03/31/11 0525 03/29/11 2259  WBC 10.6* 15.7* --  NEUTROABS -- -- 15.7*  HGB 9.5* 9.4* --  HCT 29.3* 28.7* --  MCV 95.1 94.1 --  PLT 246 243 --   No results  found for this basename: CKTOTAL:3,CKMB:3,CKMBINDEX:3,TROPONINI:3 in the last 72 hours No components found with this basename: POCBNP:3 No results found for this basename: DDIMER:2 in the last 72 hours No results found for this basename: HGBA1C:2 in the last 72 hours No results found for this basename: CHOL:2,HDL:2,LDLCALC:2,TRIG:2,CHOLHDL:2,LDLDIRECT:2 in the last 72 hours No results found for this basename: TSH,T4TOTAL,FREET3,T3FREE,THYROIDAB in the last 72 hours No results found for this basename: VITAMINB12:2,FOLATE:2,FERRITIN:2,TIBC:2,IRON:2,RETICCTPCT:2 in the last 72 hours  Micro Results: Recent Results (from the past 240 hour(s))  MRSA PCR SCREENING     Status: Normal   Collection Time   03/30/11  3:07 AM      Component Value Range Status Comment   MRSA by PCR NEGATIVE  NEGATIVE  Final     Studies/Results: Dg Foot Complete Left  03/29/2011  *RADIOLOGY REPORT*  Clinical Data: Diabetic ulcer the base of the fifth toe.  LEFT FOOT - COMPLETE 3+ VIEW  Comparison: None.  Findings: Soft tissue swelling and soft tissue defect in the lateral aspect of the forefoot over the ankle metatarsal phalangeal region.  There is a suggestion of small gas bubbles in the subcutaneous tissues which might represent soft tissue abscess or infection.  The underlying bones appear intact without evidence of cortical erosion or sclerosis.  No obvious changes of osteomyelitis.  There is thickening of the bone cortex along the shaft of the fourth metatarsal bone which probably represents old healed fracture or stress reaction.  Mild degenerative changes in  the intertarsal and tarsometatarsal joints.  Accessory navicular ossicle.  Small plantar calcaneal spur.  Vascular calcifications.  IMPRESSION: Soft tissue defect, swelling, and soft tissue gas in the lateral aspect of the left forefoot consistent with soft tissue infection. No specific evidence of osteomyelitis.  Cortical thickening along the shaft of the fourth  metatarsal bone probably representing old fracture deformity or stress reaction.  Degenerative changes in the tarsal joints.  Original Report Authenticated By: Marlon Pel, M.D.    Medications: Scheduled Meds:    . allopurinol  300 mg Oral Daily  . amLODipine  10 mg Oral Daily  . aspirin EC  81 mg Oral Daily  . collagenase   Topical BID  . cyclobenzaprine  10 mg Oral TID  . enoxaparin  40 mg Subcutaneous Daily  . fentaNYL      . gabapentin  800 mg Oral TID  . heparin      . hydrochlorothiazide  25 mg Oral Daily  . insulin aspart  0-15 Units Subcutaneous TID WC  . insulin aspart  0-5 Units Subcutaneous QHS  . insulin glargine  60 Units Subcutaneous BID  . lidocaine      . losartan  100 mg Oral Daily  . midazolam      . omega-3 acid ethyl esters  4 g Oral Daily  . piperacillin-tazobactam (ZOSYN)  IV  3.375 g Intravenous Q8H  . simvastatin  20 mg Oral q1800  . vancomycin  1,500 mg Intravenous Q24H  . vitamin B-12  100 mcg Oral Daily  . Vitamin D (Ergocalciferol)  50,000 Units Oral Weekly  . DISCONTD: enoxaparin  40 mg Subcutaneous Q24H  . DISCONTD: insulin aspart  12-30 Units Subcutaneous BID  . DISCONTD: sulfamethoxazole-trimethoprim  1 tablet Oral BID   Continuous Infusions:    . sodium chloride 100 mL/hr at 04/01/11 0536  . sodium chloride 50 mL/hr at 04/01/11 1210   PRN Meds:.acetaminophen, acetaminophen, alum & mag hydroxide-simeth, dextrose, HYDROmorphone, hydrOXYzine, ondansetron (ZOFRAN) IV, ondansetron, oxyCODONE, promethazine, zolpidem, DISCONTD: acetaminophen, DISCONTD: ondansetron (ZOFRAN) IV  Assessment/Plan:  Diabetic foot ulcer/ leukocytosis/ fevers - being managed with Zosyn and VAncomycin. Appreciate vascular surgery eval.  The patient has undergone an angiogram which reveals disease and occlusion of the superficial femoral artery.  Vascular surgery plans on doing a left fem-pop bypass and amputation tentatively scheduled for Thursday. I can change  Zosyn to Imipenem but suspect her fevers will not resolve unless adequate blood flow is established and the gangrenous area is amputated.     Diabetes mellitus- sugars noted to be extremely uncontrolled on admission although the patient states usually they are quite well controlled.   Hopefully with better controlling the infection, sugars will improve.     Hyponatremia- likely from dehydration secondary to uncontrolled sugars. The HCTZ that she takes is probably contributing as well.  I have stopped D5 and am continuing NS - sodium levels have improved.     HTN (hypertension)- resumed Norvasc. Hold ARB and HCTZ for now.   GERD (gastroesophageal reflux disease)  Gout  CKD (chronic kidney disease) stage 3, GFR 30-59 ml/min- no baseline creatinine to compare this with.  Obesity, morbid  LOS: 3 days   [facility] [phone] 04/01/2011, 4:01 PM

## 2011-04-01 NOTE — Interval H&P Note (Signed)
History and Physical Interval Note:  04/01/2011 7:38 AM  Pamela [name]  has presented today for an arteriogram, with the diagnosis of leg pain.  The various methods of treatment have been discussed with the patient and family. After consideration of risks, benefits and other options for treatment, the patient has consented to  Procedure(s): ABDOMINAL ANGIOGRAM and possible angioplasty +/or stenting. The patients' history has been reviewed, patient examined, no change in status, stable for surgery.  I have reviewed the patients' chart and labs.  Questions were answered to the patient's satisfaction.     DICKSON,CHRISTOPHER S

## 2011-04-01 NOTE — Op Note (Signed)
NAMEBatya [name]   MRN: [id number] DOB: 1951-02-09    DATE OF OPERATION: 04/01/2011  PREOP DIAGNOSIS: Diabetic foot infection left foot  POSTOP DIAGNOSIS: Diabetic foot infection left foot. 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  PROCEDURE:  1. Ultrasound-guided access to right common femoral artery 2. Aortogram with bilateral iliac arteriogram 3. Selective catheterization of left external iliac artery with left lower extremity runoff 4. Right lower extremity arteriogram 5. Perclose right common femoral artery  SURGEON: Pamela [name]. Pamela Bo, MD, FACS  ANESTHESIA: local with sedation   EBL: minimal  INDICATIONS: Pamela [name] is a 60 y.o. female who was seen in consultation by Dr. Leonette [name] fields on 03/30/2011. She has a diabetic foot infection on the left with evidence of infrainguinal arterial occlusive disease. I was asked to perform an arteriogram to further assess her for possible revascularization.  TECHNIQUE: The patient was brought to the PV lab and sedated with 1 mg of Versed and 50 mcg of fentanyl. Both groins were prepped and draped in the usual sterile fashion. After the skin was anesthetized with 1% lidocaine, and under ultrasound guidance, the right common femoral artery was cannulated and a guidewire introduced into the infrarenal aorta under fluoroscopic control. A 5 French sheath was introduced over the wire. A pigtail catheter was positioned at the L1 vertebral body and flush aortogram obtained. The catheter was then positioned above the aortic bifurcation and oblique projection was obtained in RAO projection. The pigtail catheter was then exchanged for a crossover catheter which was positioned into the left common iliac artery. An angled Glidewire was advanced into the left external iliac artery. The crossover catheter was exchanged for an endhole catheter. Selective left external iliac arteriogram was obtained with left lower extremity  runoff. The endhole catheter was then removed and right lower extremity film obtained through the right femoral sheath.  At the completion the 5 French sheath was exchanged for a Perclose device which was advanced over the wire and the wire removed. The catheter was then advanced until there was good blood return from the marker port. The foot to the device was retracted and the catheter retracted the suture was then deployed. The catheter was then removed maintaining tension on the blue suture rail. The knot was then tied and secured and there was good hemostasis noted.  FINDINGS:  1. There are single renal arteries bilaterally with no significant renal artery stenosis noted. 2. There is no significant occlusive disease or stenosis in the aorta, bilateral common iliac arteries, bilateral external iliac arteries, and bilateral hypogastric arteries. 3. On the left side, the common femoral and deep femoral artery are patent. The superficial femoral artery is occluded at its origin with reconstitution of the distal above-knee popliteal artery. On the left side there is 3 vessel runoff via the anterior tibial, posterior tibial, and peroneal arteries. 4. On the right side, the common femoral and deep femoral artery are patent. The superficial femoral artery is occluded at its origin with reconstitution of the above-knee popliteal artery. The proximal tibial vessels are patent. The peroneal is not visualized distally. The anterior tibial and posterior tibial arteries are patent.  Pamela Ferrari, MD, FACS [facility]  DATE OF DICTATION:   04/01/2011

## 2011-04-02 DIAGNOSIS — I739 Peripheral vascular disease, unspecified: Secondary | ICD-10-CM

## 2011-04-02 DIAGNOSIS — I70269 Atherosclerosis of native arteries of extremities with gangrene, unspecified extremity: Secondary | ICD-10-CM

## 2011-04-02 LAB — GLUCOSE, CAPILLARY
Glucose-Capillary: 109 mg/dL — ABNORMAL HIGH (ref 70–99)
Glucose-Capillary: 200 mg/dL — ABNORMAL HIGH (ref 70–99)

## 2011-04-02 LAB — LIPID PANEL
Cholesterol: 174 mg/dL (ref 0–200)
Total CHOL/HDL Ratio: 8.7 RATIO

## 2011-04-02 MED ORDER — VANCOMYCIN HCL IN DEXTROSE 1-5 GM/200ML-% IV SOLN
1000.0000 mg | Freq: Two times a day (BID) | INTRAVENOUS | Status: DC
  Administered 2011-04-02 – 2011-04-06 (×8): 1000 mg via INTRAVENOUS
  Filled 2011-04-02 (×10): qty 200

## 2011-04-02 MED ORDER — METOPROLOL TARTRATE 12.5 MG HALF TABLET
12.5000 mg | ORAL_TABLET | Freq: Two times a day (BID) | ORAL | Status: DC
  Administered 2011-04-02 – 2011-04-03 (×4): 12.5 mg via ORAL
  Filled 2011-04-02 (×8): qty 1

## 2011-04-02 NOTE — Progress Notes (Addendum)
[facility] Progress Note  04/02/2011 9:00 AM   No complaints.  Understands that her surgery is scheduled for Thursday by Dr. Edilia Bo. Pain well controlled with pain meds.  Tm 101.2 yesterday at 3pm; afebrile since then;  93% 5LO2NC Filed Vitals:   04/02/11 0753  BP: 111/62  Pulse: 68  Temp: 97.7 F (36.5 C)  Resp: 19     Extremities:  Left foot bandage in tact/dry. Right groin soft; no hematoma; bandage dry.  CBC    Component Value Date/Time   WBC 10.6* 04/01/2011 0959   RBC 3.08* 04/01/2011 0959   HGB 9.5* 04/01/2011 0959   HCT 29.3* 04/01/2011 0959   PLT 246 04/01/2011 0959   MCV 95.1 04/01/2011 0959   MCH 30.8 04/01/2011 0959   MCHC 32.4 04/01/2011 0959   RDW 13.3 04/01/2011 0959   LYMPHSABS 2.8 03/29/2011 2259   MONOABS 1.9* 03/29/2011 2259   EOSABS 0.0 03/29/2011 2259   BASOSABS 0.1 03/29/2011 2259    BMET    Component Value Date/Time   NA 135 04/01/2011 0959   K 4.1 04/01/2011 0959   CL 102 04/01/2011 0959   CO2 23 04/01/2011 0959   GLUCOSE 110* 04/01/2011 0959   BUN 15 04/01/2011 0959   CREATININE 1.25* 04/01/2011 0959   CALCIUM 8.6 04/01/2011 0959   GFRNONAA 45* 04/01/2011 0959   GFRAA 53* 04/01/2011 0959     Intake/Output Summary (Last 24 hours) at 04/02/11 0900 Last data filed at 04/02/11 0300  Gross per 24 hour  Intake   1180 ml  Output      0 ml  Net   1180 ml     Assessment/Plan:  61 y.o. female is s/p arteriogram POD 1 -doing well this am from arteriogram. -for left fem-pop bypass graft on Thursday and amputation of 5th and possibly 4th toe by Dr. Edilia Bo. -pt states vein mapping was done, but results are not in EPIC yet. -WBC improved.  Continue ABx -Hgb stable. -continue current care.   Newton Pigg, PA-C [facility] [phone] 04/02/2011 9:00 AM   I have reviewed and agree with above.  Larina Earthly, MD 04/02/2011 9:39 AM

## 2011-04-02 NOTE — Progress Notes (Addendum)
SUBJECTIVE: Complains of left foot pain. No chest pain or shortness of breath.  Principal Problem:  *Diabetic foot ulcer Active Problems:  Diabetes mellitus  Obesities, morbid  Hyponatremia  HTN (hypertension)  GERD (gastroesophageal reflux disease)  Gout  CKD (chronic kidney disease) stage 3, GFR 30-59 ml/min  PAD (peripheral artery disease)   LABS: Basic Metabolic Panel:  Basename 04/01/11 0959 03/31/11 0525  NA 135 127*  K 4.1 4.0  CL 102 95*  CO2 23 25  GLUCOSE 110* 204*  BUN 15 21  CREATININE 1.25* 1.43*  CALCIUM 8.6 8.9  MG -- --  PHOS -- --   CBC:  Basename 04/01/11 0959 03/31/11 0525  WBC 10.6* 15.7*  NEUTROABS -- --  HGB 9.5* 9.4*  HCT 29.3* 28.7*  MCV 95.1 94.1  PLT 246 243   Echo 03/31/2011 Study Conclusions - Left ventricle: The cavity size was normal. Wall thickness was increased in a pattern of mild LVH. Systolic function was normal. The estimated ejection fraction was in the range of 55% to 60%. Wall motion was normal; there were no regional wall motion abnormalities. Features are consistent with a pseudonormal left ventricular filling pattern, with concomitant abnormal relaxation and increased filling pressure (grade 2 diastolic dysfunction). - Mitral valve: Trivial regurgitation. - Left atrium: The atrium was mildly dilated. - Right ventricle: The cavity size was normal. Systolic function was normal. - Right atrium: The atrium was mildly dilated. - Pulmonary arteries: No complete TR doppler jet so unable to estimate PA systolic pressure. - Systemic veins: IVC is dilated to 3 cm with some respirophasic variation, suggesting RA pressure at least 15 mmHg. Impressions:  Aortogram with bilateral iliac arteriogram 04/01/2011 FINDINGS:  1. There are single renal arteries bilaterally with no significant renal artery stenosis noted.  2. There is no significant occlusive disease or stenosis in the aorta, bilateral common iliac arteries,  bilateral external iliac arteries, and bilateral hypogastric arteries.  3. On the left side, the common femoral and deep femoral artery are patent. The superficial femoral artery is occluded at its origin with reconstitution of the distal above-knee popliteal artery. On the left side there is 3 vessel runoff via the anterior tibial, posterior tibial, and peroneal arteries.  4. On the right side, the common femoral and deep femoral artery are patent. The superficial femoral artery is occluded at its origin with reconstitution of the above-knee popliteal artery. The proximal tibial vessels are patent. The peroneal is not visualized distally. The anterior tibial and posterior tibial arteries are patent.    No results found for this basename: HGBA1C in the last 72 hours Fasting Lipid Panel:  Basename 04/02/11 0443  CHOL 174  HDL 20*  LDLCALC 115*  TRIG 196*  CHOLHDL 8.7  LDLDIRECT --     PHYSICAL EXAM BP 128/61  Pulse 72  Temp(Src) 98 F (36.7 C) (Oral)  Resp 19  Ht 5\' 6"  (1.676 m)  Wt 235 lb 14.3 oz (107 kg)  BMI 38.07 kg/m2  SpO2 93% General: Well developed, well nourished, in no acute distress Head: Eyes PERRLA, No xanthomas.   Normal cephalic and atramatic  Lungs: Clear bilaterally to auscultation and percussion. Heart: HRRR S1 S2, Distant sounds. No MRG .  Pulses are 2+ & equal.            No carotid bruit. No JVD.  No abdominal bruits. No femoral bruits. Abdomen: Bowel sounds are positive, abdomen soft and non-tender without masses or  Hernia's noted. Msk:  Back normal, normal gait. Normal strength and tone for age. Extremities: No clubbing, cyanosis or edema.  DP +1 Neuro: Alert and oriented X 3. Psych:  Good affect, responds appropriately  TELEMETRY: Reviewed telemetry pt in SR/SB  ASSESSMENT AND PLAN: 1. PAD: Surgery planned for Thursday of this week. She has been cleared by cardiology to proceed . Continue meds as ordered. Labs are pending for this  am.Will be available as needed peri-operatively.   Bettey Mare. Lyman Bishop NP [facility] 04/02/2011, 7:39 AM  Attending note:  Patient seen and examined. No chest pain or dyspnea.  On exam BP 11/62, HR 68 in sinus rhythm by telemetry. Lungs clear, cor with RRR and no S3.  Labwork reviewed including lipids with LDL 115, HDL 20.  On ASA, Norvasc, Lovenox, Zocor, Lovaza, insulin. Echocardiogram report reviewed with LVEF 55-60%.  Patient for vascular surgery this week. Would add low dose beta blocker if tolerates. Our service following.  Jonelle Sidle, M.D., F.A.C.C.

## 2011-04-02 NOTE — Progress Notes (Signed)
ANTIBIOTIC CONSULT NOTE - FOLLOW UP  Pharmacy Consult for vancomycin/zosyn Indication: diabetic foot ulcer  Allergies  Allergen Reactions  . Azo (Phenazopyridine Hcl) Itching, Swelling and Rash  . Ciprofloxacin     According to MD notes pt has allergy to Cipro  . Codeine Itching and Rash    Patient Measurements: Height: 5\' 6"  (167.6 cm) Weight: 235 lb 14.3 oz (107 kg) IBW/kg (Calculated) : 59.3   Vital Signs: Temp: 98.4 F (36.9 C) (01/01 1237) Temp src: Oral (01/01 1237) BP: 110/60 mmHg (01/01 1237) Pulse Rate: 60  (01/01 1237) Intake/Output from previous day: 12/31 0701 - 01/01 0700 In: 1342.5 [P.O.:480; I.V.:850; IV Piggyback:12.5] Out: -   Labs:  [facility] 04/01/11 0959 03/31/11 0525  WBC 10.6* 15.7*  HGB 9.5* 9.4*  PLT 246 243  LABCREA -- --  CREATININE 1.25* 1.43*   Estimated Creatinine Clearance: 58.5 ml/min (by C-G formula based on Cr of 1.25).  Assessment: 61 yo female on day 4 on vancomycin/zosyn for diabetic foot infection with gangrenous toes (noted for OR this Thursday). Patient with recent fever but WBC trend has gone down (no cultures noted)  Goal of Therapy:  Vancomycin trough level 10-15 mcg/ml  Plan:  -With patient weight of 107 kg and recent fever will change vancomycin to 1000mg  IV q12h with next dose now. -Will plan a trough at steady state  Benny Lennert 04/02/2011,1:29 PM

## 2011-04-02 NOTE — Progress Notes (Signed)
Subjective: This is a 61 y/o female with diabetes who was admitted for DKA and an infected left foot ulcer with gangrenous changes of the the 5th toe  She has no complaints other than pain in her right foot which is mild and mostly controlled with medication. She has no questions in regards to the procedure planned for Thurdsday.   Objective: Blood pressure 110/60, pulse 60, temperature 98.4 F (36.9 C), temperature source Oral, resp. rate 15, height 5\' 6"  (1.676 m), weight 107 kg (235 lb 14.3 oz), SpO2 96.00%. Weight change:   Intake/Output Summary (Last 24 hours) at 04/02/11 1450 Last data filed at 04/02/11 1400  Gross per 24 hour  Intake    810 ml  Output    450 ml  Net    360 ml    Physical Exam: General appearance: alert, cooperative and no distress Head: Normocephalic, without obvious abnormality, atraumatic Eyes: conjunctivae/corneas clear. PERRL, EOM's intact.  Throat: lips, mucosa, and tongue normal Lungs: clear to auscultation bilaterally Heart: regular rate and rhythm, S1, S2 normal, no murmur, click, rub or gallop Abdomen: soft, non-tender; bowel sounds normal; no masses,  no organomegaly Extremities: extremities normal, atraumatic, no cyanosis or edema Skin: Skin color, texture, turgor normal. No rashes or lesions or ulcer present on lateral planter aspect of left foot with gangrenous changes noted adjacent to it and in the 5th toe. Neurologic: Grossly normal  Lab Results:  [facility] 04/01/11 0959 03/31/11 0525  NA 135 127*  K 4.1 4.0  CL 102 95*  CO2 23 25  GLUCOSE 110* 204*  BUN 15 21  CREATININE 1.25* 1.43*  CALCIUM 8.6 8.9  MG -- --  PHOS -- --   No results found for this basename: AST:2,ALT:2,ALKPHOS:2,BILITOT:2,PROT:2,ALBUMIN:2 in the last 72 hours No results found for this basename: LIPASE:2,AMYLASE:2 in the last 72 hours  Basename 04/01/11 0959 03/31/11 0525  WBC 10.6* 15.7*  NEUTROABS -- --  HGB 9.5* 9.4*  HCT 29.3* 28.7*  MCV 95.1 94.1  PLT  246 243   No results found for this basename: CKTOTAL:3,CKMB:3,CKMBINDEX:3,TROPONINI:3 in the last 72 hours No components found with this basename: POCBNP:3 No results found for this basename: DDIMER:2 in the last 72 hours No results found for this basename: HGBA1C:2 in the last 72 hours  Basename 04/02/11 0443  CHOL 174  HDL 20*  LDLCALC 115*  TRIG 196*  CHOLHDL 8.7  LDLDIRECT --   No results found for this basename: TSH,T4TOTAL,FREET3,T3FREE,THYROIDAB in the last 72 hours No results found for this basename: VITAMINB12:2,FOLATE:2,FERRITIN:2,TIBC:2,IRON:2,RETICCTPCT:2 in the last 72 hours  Micro Results: Recent Results (from the past 240 hour(s))  MRSA PCR SCREENING     Status: Normal   Collection Time   03/30/11  3:07 AM      Component Value Range Status Comment   MRSA by PCR NEGATIVE  NEGATIVE  Final     Studies/Results: Dg Foot Complete Left  03/29/2011  *RADIOLOGY REPORT*  Clinical Data: Diabetic ulcer the base of the fifth toe.  LEFT FOOT - COMPLETE 3+ VIEW  Comparison: None.  Findings: Soft tissue swelling and soft tissue defect in the lateral aspect of the forefoot over the ankle metatarsal phalangeal region.  There is a suggestion of small gas bubbles in the subcutaneous tissues which might represent soft tissue abscess or infection.  The underlying bones appear intact without evidence of cortical erosion or sclerosis.  No obvious changes of osteomyelitis.  There is thickening of the bone cortex along the shaft of the  fourth metatarsal bone which probably represents old healed fracture or stress reaction.  Mild degenerative changes in the intertarsal and tarsometatarsal joints.  Accessory navicular ossicle.  Small plantar calcaneal spur.  Vascular calcifications.  IMPRESSION: Soft tissue defect, swelling, and soft tissue gas in the lateral aspect of the left forefoot consistent with soft tissue infection. No specific evidence of osteomyelitis.  Cortical thickening along the  shaft of the fourth metatarsal bone probably representing old fracture deformity or stress reaction.  Degenerative changes in the tarsal joints.  Original Report Authenticated By: Marlon Pel, M.D.    Medications: Scheduled Meds:    . allopurinol  300 mg Oral Daily  . amLODipine  10 mg Oral Daily  . aspirin EC  81 mg Oral Daily  . collagenase   Topical BID  . cyclobenzaprine  10 mg Oral TID  . enoxaparin  40 mg Subcutaneous Daily  . gabapentin  800 mg Oral TID  . imipenem-cilastatin  500 mg Intravenous Q8H  . insulin aspart  0-15 Units Subcutaneous TID WC  . insulin aspart  0-5 Units Subcutaneous QHS  . insulin glargine  60 Units Subcutaneous BID  . losartan  100 mg Oral Daily  . metoprolol tartrate  12.5 mg Oral BID  . omega-3 acid ethyl esters  4 g Oral Daily  . simvastatin  20 mg Oral q1800  . vancomycin  1,000 mg Intravenous Q12H  . vitamin B-12  100 mcg Oral Daily  . Vitamin D (Ergocalciferol)  50,000 Units Oral Weekly  . DISCONTD: hydrochlorothiazide  25 mg Oral Daily  . DISCONTD: piperacillin-tazobactam (ZOSYN)  IV  3.375 g Intravenous Q8H  . DISCONTD: vancomycin  1,500 mg Intravenous Q24H   Continuous Infusions:    . sodium chloride 100 mL/hr at 04/01/11 0536  . sodium chloride 50 mL/hr at 04/01/11 1210   PRN Meds:.acetaminophen, acetaminophen, alum & mag hydroxide-simeth, dextrose, HYDROmorphone, hydrOXYzine, ondansetron (ZOFRAN) IV, ondansetron, oxyCODONE, promethazine, zolpidem  Assessment/Plan:  Diabetic foot ulcer/ leukocytosis/ fevers - Was initially being managed with Zosyn and VAncomycin. Due to continued fevers, I have changed her Zosyn to Imipenem. Fevers have resolved for the time being and leukocytosis continues to improve. Appreciate vascular surgery eval.  The patient has undergone an angiogram which reveals disease and occlusion of the superficial femoral artery.  Vascular surgery plans on doing a left fem-pop bypass and amputation tentatively  scheduled for Thursday.      Diabetes mellitus- sugars noted to be extremely uncontrolled on admission although the patient states usually they are quite well controlled.  They have steadily improved and today are ranging below 150.   She is on a high dose sliding scale and thus for seems to be tolerating it. Will continue for now. In the next 24-48 hrs as her infection further comes under control, we may have to downgrade her to a more moderate scale.    Hypernatremia- likely from dehydration secondary to uncontrolled sugars. The HCTZ that she takes is probably contributing as well.  I have now stopped IVF.    HTN (hypertension)- resumed Norvasc. Hold ARB and HCTZ for now.   GERD (gastroesophageal reflux disease)  Gout  CKD (chronic kidney disease) stage 3, GFR 30-59 ml/min- no baseline creatinine to compare this with.  Obesity, morbid    LOS: 4 days   [facility] [phone] 04/02/2011, 2:50 PM

## 2011-04-03 DIAGNOSIS — Z01810 Encounter for preprocedural cardiovascular examination: Secondary | ICD-10-CM

## 2011-04-03 LAB — GLUCOSE, CAPILLARY
Glucose-Capillary: 147 mg/dL — ABNORMAL HIGH (ref 70–99)
Glucose-Capillary: 213 mg/dL — ABNORMAL HIGH (ref 70–99)

## 2011-04-03 LAB — PROTIME-INR: Prothrombin Time: 14.3 seconds (ref 11.6–15.2)

## 2011-04-03 LAB — ABO/RH: ABO/RH(D): A POS

## 2011-04-03 LAB — TYPE AND SCREEN

## 2011-04-03 MED ORDER — ENOXAPARIN SODIUM 40 MG/0.4ML ~~LOC~~ SOLN
40.0000 mg | Freq: Every day | SUBCUTANEOUS | Status: AC
  Administered 2011-04-03: 40 mg via SUBCUTANEOUS

## 2011-04-03 NOTE — Progress Notes (Signed)
   CARE MANAGEMENT NOTE 04/03/2011  Patient:  Kilty,Eh   Account Number:  [id number]  Date Initiated:  04/03/2011  Documentation initiated by:  Onnie Boer  Subjective/Objective Assessment:   PT WAS ADMITTED WITH DM AND CELLULITIS     Action/Plan:   PROGRESSION OF CARE AND DISCHARGE PLANNING   Anticipated DC Date:  04/10/2011   Anticipated DC Plan:  SKILLED NURSING FACILITY  In-house referral  Clinical Social Worker      DC Planning Services  CM consult      Choice offered to / List presented to:             Status of service:  In process, will continue to follow Medicare Important Message given?   (If response is "NO", the following Medicare IM given date fields will be blank) Date Medicare IM given:   Date Additional Medicare IM given:    Discharge Disposition:    Per UR Regulation:  Reviewed for med. necessity/level of care/duration of stay  Comments:  04/03/11 Onnie Boer, RN, BSN 1530 PT IS TO HAVE AN AMPUTATION TODAY AND MAY NEED REHAB AT DC, WILL F/U ON PT EVAL RECOMMENDATIONS

## 2011-04-03 NOTE — Progress Notes (Signed)
Patient ID: Pamela [name], female   DOB: [birth year], 61 y.o.   MRN: [id number] Subjective: No events overnight. Patient denies chest pain, shortness of breath, abdominal pain.   Objective:  Vital signs in last 24 hours:  Filed Vitals:   04/03/11 0319 04/03/11 0800 04/03/11 1126 04/03/11 1131  BP: 114/59 127/54 128/59   Pulse: 68 60  62  Temp: 98.8 F (37.1 C) 98.4 F (36.9 C)    TempSrc: Oral Oral    Resp: 23 22    Height:      Weight:      SpO2: 95% 94%      Intake/Output from previous day:   Intake/Output Summary (Last 24 hours) at 04/03/11 1152 Last data filed at 04/03/11 0800  Gross per 24 hour  Intake   1155 ml  Output    700 ml  Net    455 ml    Physical Exam: General: Alert, awake, oriented x3, in no acute distress. HEENT: No bruits, no goiter. Moist mucous membranes, no scleral icterus, no conjunctival pallor. Heart: Regular rate and rhythm, S1/S2 +, no murmurs, rubs, gallops. Lungs: Clear to auscultation bilaterally. No wheezing, no rhonchi, no rales.  Abdomen: Soft, nontender, nondistended, positive bowel sounds. Extremities: left foot ulcer; dry gangrene of left fifth toe Neuro: Grossly nonfocal.  Lab Results:  Basic Metabolic Panel:    Component Value Date/Time   NA 135 04/01/2011 0959   K 4.1 04/01/2011 0959   CL 102 04/01/2011 0959   CO2 23 04/01/2011 0959   BUN 15 04/01/2011 0959   CREATININE 1.25* 04/01/2011 0959   GLUCOSE 110* 04/01/2011 0959   CALCIUM 8.6 04/01/2011 0959   CBC:    Component Value Date/Time   WBC 10.6* 04/01/2011 0959   HGB 9.5* 04/01/2011 0959   HCT 29.3* 04/01/2011 0959   PLT 246 04/01/2011 0959   MCV 95.1 04/01/2011 0959   NEUTROABS 15.7* 03/29/2011 2259   LYMPHSABS 2.8 03/29/2011 2259   MONOABS 1.9* 03/29/2011 2259   EOSABS 0.0 03/29/2011 2259   BASOSABS 0.1 03/29/2011 2259      Lab 04/01/11 0959 03/31/11 0525 03/30/11 0700 03/29/11 2259  WBC 10.6* 15.7* 17.7* 20.5*  HGB 9.5* 9.4* 10.0* 10.8*  HCT 29.3*  28.7* 29.5* 32.6*  PLT 246 243 251 270  MCV 95.1 94.1 92.2 95.6  MCH 30.8 30.8 31.3 31.7  MCHC 32.4 32.8 33.9 33.1  RDW 13.3 13.2 13.3 13.7  LYMPHSABS -- -- -- 2.8  MONOABS -- -- -- 1.9*  EOSABS -- -- -- 0.0  BASOSABS -- -- -- 0.1  BANDABS -- -- -- --    Lab 04/01/11 0959 03/31/11 0525 03/30/11 0700 03/29/11 2259  NA 135 127* 128* 120*  K 4.1 4.0 3.6 4.2  CL 102 95* 91* 83*  CO2 23 25 27 22   GLUCOSE 110* 204* 237* 782*  BUN 15 21 25* 25*  CREATININE 1.25* 1.43* 1.36* 1.53*  CALCIUM 8.6 8.9 9.6 9.3  MG -- -- -- --    Lab 04/03/11 0950  INR 1.09  PROTIME --   Cardiac markers: No results found for this basename: CK:3,CKMB:3,TROPONINI:3,MYOGLOBIN:3 in the last 168 hours No components found with this basename: POCBNP:3 Recent Results (from the past 240 hour(s))  MRSA PCR SCREENING     Status: Normal   Collection Time   03/30/11  3:07 AM      Component Value Range Status Comment   MRSA by PCR NEGATIVE  NEGATIVE  Final     Studies/Results:  No results found.  Medications: Scheduled Meds:   . allopurinol  300 mg Oral Daily  . amLODipine  10 mg Oral Daily  . aspirin EC  81 mg Oral Daily  . collagenase   Topical BID  . cyclobenzaprine  10 mg Oral TID  . enoxaparin  40 mg Subcutaneous Daily  . gabapentin  800 mg Oral TID  . imipenem-cilastatin  500 mg Intravenous Q8H  . insulin aspart  0-15 Units Subcutaneous TID WC  . insulin aspart  0-5 Units Subcutaneous QHS  . insulin glargine  60 Units Subcutaneous BID  . losartan  100 mg Oral Daily  . metoprolol tartrate  12.5 mg Oral BID  . omega-3 acid ethyl esters  4 g Oral Daily  . simvastatin  20 mg Oral q1800  . vancomycin  1,000 mg Intravenous Q12H  . vitamin B-12  100 mcg Oral Daily  . Vitamin D (Ergocalciferol)  50,000 Units Oral Weekly  . DISCONTD: enoxaparin  40 mg Subcutaneous Daily  . DISCONTD: vancomycin  1,500 mg Intravenous Q24H   Continuous Infusions:   . DISCONTD: sodium chloride 100 mL/hr at 04/01/11  0536  . DISCONTD: sodium chloride 50 mL/hr at 04/01/11 1210   PRN Meds:.acetaminophen, acetaminophen, alum & mag hydroxide-simeth, dextrose, HYDROmorphone, hydrOXYzine, ondansetron (ZOFRAN) IV, ondansetron, oxyCODONE, promethazine, zolpidem  Assessment/Plan:  Principal Problem:   *Diabetic foot ulcer - still some drainage - vascular surgery is following - plan for fem-pop bypass and left fifth toe amputation - continue imipenem  Active Problems:  PAD (peripheral artery disease) - left femoropopliteal bypass and amputation of the left fifth, possible left fourth, or possible forefoot amputation  Diabetes mellitus - continue to monitor CBG - sliding scale insulin   Hyponatremia - resolved   HTN (hypertension) - BP 12859; at goal   Gout - continue allopurinol   CKD (chronic kidney disease) stage 3, GFR 30-59 ml/min - continue to monitor    EDUCATION - test results and diagnostic studies were discussed with patient  at the bedside - patient has verbalized the understanding - questions were answered at the bedside and contact information was provided for additional questions or concerns    LOS: 5 days   Pamela [name] 04/03/2011, 11:52 AM  [facility] Pager: [phone]

## 2011-04-03 NOTE — Progress Notes (Signed)
VASCULAR PROGRESS NOTE  SUBJECTIVE: No complaints this morning.  PHYSICAL EXAM: Filed Vitals:   04/02/11 2327 04/03/11 0000 04/03/11 0314 04/03/11 0319  BP: 112/53   114/59  Pulse: 65 63  68  Temp: 100.7 F (38.2 C)   98.8 F (37.1 C)  TempSrc: Oral   Oral  Resp: 14 14  23  Height:      Weight:   241 lb 13.5 oz (109.7 kg)   SpO2: 93% 95%  95%   Lungs: clear to auscultation Left foot unchanged. Palpable left femoral pulse.  LABS: Lab Results  Component Value Date   WBC 10.6* 04/01/2011   HGB 9.5* 04/01/2011   HCT 29.3* 04/01/2011   MCV 95.1 04/01/2011   PLT 246 04/01/2011   Lab Results  Component Value Date   CREATININE 1.25* 04/01/2011   CBG (last 3)   Basename 04/02/11 2122 04/02/11 1648 04/02/11 1227  GLUCAP 200* 162* 144*   ASSESSMENT/PLAN: 1. For a left femoropopliteal bypass and amputation of the left fifth, possible left fourth, or possible forefoot amputation tomorrow. I have previously discussed the procedure and potential complications. 2. Vein map was done but results not yet document in EPIC.  Lysha Schrade, MD, FACS Beeper: [phone] 04/03/2011    

## 2011-04-03 NOTE — Progress Notes (Signed)
Left Lower Extremity Vein Map    Left Great Saphenous Vein   Segment Diameter Comment  1. Origin 8.3 mm   2. High Thigh 5.5 mm branch  3. Mid Thigh 4.9 mm branch  4. Low Thigh 4.5 mm branch  5. At Knee 3.8 mm   6. High Calf 3.5 mm Multiple branches  7. Low Calf 2.7 mm medial branch and 2.5 mm   branch  8. Ankle 3.1 mm medial branch and 2.1 mm  branch   mm    mm    mm     The greater saphenous vein appears patent throughout the left lower extremity.  Pamela [name] 04/03/2011, 3:25 PM

## 2011-04-03 NOTE — Progress Notes (Signed)
Patient ID: Pamela [name], female   DOB: 24-Sep-1950, 61 y.o.   MRN: [id number] SUBJECTIVE: Complains of left foot pain. No chest pain or shortness of breath.  Principal Problem:  *Diabetic foot ulcer Active Problems:  Diabetes mellitus  Obesities, morbid  Hyponatremia  HTN (hypertension)  GERD (gastroesophageal reflux disease)  Gout  CKD (chronic kidney disease) stage 3, GFR 30-59 ml/min  PAD (peripheral artery disease)   LABS: Basic Metabolic Panel:  Basename 04/01/11 0959  NA 135  K 4.1  CL 102  CO2 23  GLUCOSE 110*  BUN 15  CREATININE 1.25*  CALCIUM 8.6  MG --  PHOS --   CBC:  Basename 04/01/11 0959  WBC 10.6*  NEUTROABS --  HGB 9.5*  HCT 29.3*  MCV 95.1  PLT 246   Echo 03/31/2011 Study Conclusions - Left ventricle: The cavity size was normal. Wall thickness was increased in a pattern of mild LVH. Systolic function was normal. The estimated ejection fraction was in the range of 55% to 60%. Wall motion was normal; there were no regional wall motion abnormalities. Features are consistent with a pseudonormal left ventricular filling pattern, with concomitant abnormal relaxation and increased filling pressure (grade 2 diastolic dysfunction). - Mitral valve: Trivial regurgitation. - Left atrium: The atrium was mildly dilated. - Right ventricle: The cavity size was normal. Systolic function was normal. - Right atrium: The atrium was mildly dilated. - Pulmonary arteries: No complete TR doppler jet so unable to estimate PA systolic pressure. - Systemic veins: IVC is dilated to 3 cm with some respirophasic variation, suggesting RA pressure at least 15 mmHg. Impressions:  Aortogram with bilateral iliac arteriogram 04/01/2011 FINDINGS:  1. There are single renal arteries bilaterally with no significant renal artery stenosis noted.  2. There is no significant occlusive disease or stenosis in the aorta, bilateral common iliac arteries, bilateral external iliac  arteries, and bilateral hypogastric arteries.  3. On the left side, the common femoral and deep femoral artery are patent. The superficial femoral artery is occluded at its origin with reconstitution of the distal above-knee popliteal artery. On the left side there is 3 vessel runoff via the anterior tibial, posterior tibial, and peroneal arteries.  4. On the right side, the common femoral and deep femoral artery are patent. The superficial femoral artery is occluded at its origin with reconstitution of the above-knee popliteal artery. The proximal tibial vessels are patent. The peroneal is not visualized distally. The anterior tibial and posterior tibial arteries are patent.    No results found for this basename: HGBA1C in the last 72 hours Fasting Lipid Panel:  Basename 04/02/11 0443  CHOL 174  HDL 20*  LDLCALC 115*  TRIG 196*  CHOLHDL 8.7  LDLDIRECT --     PHYSICAL EXAM BP 114/59  Pulse 68  Temp(Src) 98.8 F (37.1 C) (Oral)  Resp 23  Ht 5\' 6"  (1.676 m)  Wt 109.7 kg (241 lb 13.5 oz)  BMI 39.03 kg/m2  SpO2 95% General: Well developed, well nourished, in no acute distress Head: Eyes PERRLA, No xanthomas.   Normal cephalic and atramatic  Lungs: Clear bilaterally to auscultation and percussion. Heart: HRRR S1 S2, Distant sounds. No MRG .  Pulses are 2+ & equal.            No carotid bruit. No JVD.  No abdominal bruits. No femoral bruits. Abdomen: Bowel sounds are positive, abdomen soft and non-tender without masses or  Hernia's noted. Msk:  Back normal, normal gait. Normal strength and tone for age. Extremities: No clubbing, cyanosis or edema.  DP +1 Neuro: Alert and oriented X 3. Psych:  Good affect, responds appropriately  TELEMETRY: Reviewed telemetry pt in SR/SB  ASSESSMENT AND PLAN: 1. PAD: Surgery planned for Thursday of this week. She has been cleared by cardiology to proceed . Continue meds as ordered. Labs are pending for this am.Will be available as  needed peri-operatively. 2. HTN:  Well controlld continue current meds

## 2011-04-04 ENCOUNTER — Inpatient Hospital Stay (HOSPITAL_COMMUNITY): Payer: Medicaid - Out of State

## 2011-04-04 ENCOUNTER — Encounter (HOSPITAL_COMMUNITY): Payer: Self-pay | Admitting: Anesthesiology

## 2011-04-04 ENCOUNTER — Inpatient Hospital Stay (HOSPITAL_COMMUNITY): Payer: Medicaid - Out of State | Admitting: Anesthesiology

## 2011-04-04 ENCOUNTER — Encounter (HOSPITAL_COMMUNITY)
Admission: EM | Disposition: A | Discharge status: Skilled Nursing Facility | Payer: Self-pay | Source: Home / Self Care | Attending: Internal Medicine

## 2011-04-04 ENCOUNTER — Other Ambulatory Visit: Payer: Self-pay | Admitting: Vascular Surgery

## 2011-04-04 DIAGNOSIS — Z01810 Encounter for preprocedural cardiovascular examination: Secondary | ICD-10-CM

## 2011-04-04 HISTORY — PX: AMPUTATION: SHX166

## 2011-04-04 HISTORY — PX: FEMORAL-POPLITEAL BYPASS GRAFT: SHX937

## 2011-04-04 LAB — GLUCOSE, CAPILLARY
Glucose-Capillary: 69 mg/dL — ABNORMAL LOW (ref 70–99)
Glucose-Capillary: 71 mg/dL (ref 70–99)
Glucose-Capillary: 230 mg/dL — ABNORMAL HIGH (ref 70–99)

## 2011-04-04 SURGERY — BYPASS GRAFT FEMORAL-POPLITEAL ARTERY
Anesthesia: General | Site: Toe | Laterality: Left | Wound class: Clean

## 2011-04-04 MED ORDER — SODIUM CHLORIDE 0.9 % IV SOLN: 500.0000 mL | Freq: Once | INTRAVENOUS | Status: AC | PRN

## 2011-04-04 MED ORDER — PHENOL 1.4 % MT LIQD: 1.0000 | OROMUCOSAL | Status: DC | PRN

## 2011-04-04 MED ORDER — MAGNESIUM SULFATE 40 MG/ML IJ SOLN
2.0000 g | Freq: Once | INTRAMUSCULAR | Status: AC | PRN
  Filled 2011-04-04: qty 50

## 2011-04-04 MED ORDER — DEXTROSE 5 % IV SOLN: 1.5000 g | Freq: Two times a day (BID) | INTRAVENOUS | Status: DC

## 2011-04-04 MED ORDER — FAMOTIDINE IN NACL 20-0.9 MG/50ML-% IV SOLN
20.0000 mg | Freq: Two times a day (BID) | INTRAVENOUS | Status: DC
  Administered 2011-04-04 – 2011-04-06 (×4): 20 mg via INTRAVENOUS
  Filled 2011-04-04 (×6): qty 50

## 2011-04-04 MED ORDER — SODIUM CHLORIDE 0.9 % IR SOLN
Status: DC | PRN
  Administered 2011-04-04: 11:00:00

## 2011-04-04 MED ORDER — METOPROLOL TARTRATE 12.5 MG HALF TABLET
12.5000 mg | ORAL_TABLET | Freq: Two times a day (BID) | ORAL | Status: DC
  Administered 2011-04-04 – 2011-04-07 (×6): 12.5 mg via ORAL
  Filled 2011-04-04 (×7): qty 1

## 2011-04-04 MED ORDER — FENTANYL CITRATE 0.05 MG/ML IJ SOLN
INTRAMUSCULAR | Status: DC | PRN
  Administered 2011-04-04: 250 ug via INTRAVENOUS
  Administered 2011-04-04: 50 ug via INTRAVENOUS

## 2011-04-04 MED ORDER — HYDROMORPHONE HCL PF 1 MG/ML IJ SOLN
0.2500 mg | INTRAMUSCULAR | Status: DC | PRN
Start: 2011-04-04 — End: 2011-04-04
  Administered 2011-04-04 (×2): 0.5 mg via INTRAVENOUS

## 2011-04-04 MED ORDER — HEPARIN SODIUM (PORCINE) 1000 UNIT/ML IJ SOLN
INTRAMUSCULAR | Status: DC | PRN
  Administered 2011-04-04: 9000 [IU] via INTRAVENOUS

## 2011-04-04 MED ORDER — POTASSIUM CHLORIDE IN NACL 20-0.9 MEQ/L-% IV SOLN
INTRAVENOUS | Status: DC
  Administered 2011-04-04: 18:00:00 via INTRAVENOUS
  Filled 2011-04-04 (×4): qty 1000

## 2011-04-04 MED ORDER — NALOXONE HCL 0.4 MG/ML IJ SOLN: 0.4000 mg | INTRAMUSCULAR | Status: DC | PRN

## 2011-04-04 MED ORDER — ONDANSETRON HCL 4 MG/2ML IJ SOLN: 4.0000 mg | Freq: Four times a day (QID) | INTRAMUSCULAR | Status: DC | PRN

## 2011-04-04 MED ORDER — CYCLOBENZAPRINE HCL 10 MG PO TABS
10.0000 mg | ORAL_TABLET | Freq: Three times a day (TID) | ORAL | Status: DC | PRN
  Administered 2011-04-09: 10 mg via ORAL
  Filled 2011-04-04: qty 1

## 2011-04-04 MED ORDER — IOHEXOL 300 MG/ML  SOLN
INTRAMUSCULAR | Status: DC | PRN
  Administered 2011-04-04: 18 mL via INTRAVENOUS

## 2011-04-04 MED ORDER — DOCUSATE SODIUM 100 MG PO CAPS: 100.0000 mg | ORAL_CAPSULE | Freq: Every day | ORAL | Status: DC

## 2011-04-04 MED ORDER — SODIUM CHLORIDE 0.9 % IJ SOLN: 9.0000 mL | INTRAMUSCULAR | Status: DC | PRN

## 2011-04-04 MED ORDER — 0.9 % SODIUM CHLORIDE (POUR BTL) OPTIME
TOPICAL | Status: DC | PRN
  Administered 2011-04-04: 1000 mL

## 2011-04-04 MED ORDER — DEXTROSE 50 % IV SOLN
INTRAVENOUS | Status: DC | PRN
  Administered 2011-04-04: 15 mL via INTRAVENOUS
  Administered 2011-04-04: 25 mL via INTRAVENOUS
  Administered 2011-04-04: 10 mL via INTRAVENOUS

## 2011-04-04 MED ORDER — EPHEDRINE SULFATE 50 MG/ML IJ SOLN
INTRAMUSCULAR | Status: DC | PRN
  Administered 2011-04-04: 5 mg via INTRAVENOUS
  Administered 2011-04-04: 10 mg via INTRAVENOUS

## 2011-04-04 MED ORDER — GUAIFENESIN-DM 100-10 MG/5ML PO SYRP: 15.0000 mL | ORAL_SOLUTION | ORAL | Status: DC | PRN

## 2011-04-04 MED ORDER — HYDRALAZINE HCL 20 MG/ML IJ SOLN
10.0000 mg | INTRAMUSCULAR | Status: DC | PRN
  Filled 2011-04-04: qty 0.5

## 2011-04-04 MED ORDER — DOPAMINE-DEXTROSE 3.2-5 MG/ML-% IV SOLN: 3.0000 ug/kg/min | INTRAVENOUS | Status: DC

## 2011-04-04 MED ORDER — ONDANSETRON HCL 4 MG/2ML IJ SOLN: 4.0000 mg | Freq: Once | INTRAMUSCULAR | Status: DC | PRN

## 2011-04-04 MED ORDER — VECURONIUM BROMIDE 10 MG IV SOLR
INTRAVENOUS | Status: DC | PRN
  Administered 2011-04-04: 6 mg via INTRAVENOUS

## 2011-04-04 MED ORDER — ACETAMINOPHEN 325 MG PO TABS: 325.0000 mg | ORAL_TABLET | ORAL | Status: DC | PRN

## 2011-04-04 MED ORDER — LABETALOL HCL 5 MG/ML IV SOLN: 10.0000 mg | INTRAVENOUS | Status: DC | PRN

## 2011-04-04 MED ORDER — DIPHENHYDRAMINE HCL 12.5 MG/5ML PO ELIX
12.5000 mg | ORAL_SOLUTION | Freq: Four times a day (QID) | ORAL | Status: DC | PRN
  Filled 2011-04-04: qty 5

## 2011-04-04 MED ORDER — PAPAVERINE HCL 30 MG/ML IJ SOLN
INTRAMUSCULAR | Status: DC | PRN
  Administered 2011-04-04: 60 mg via INTRAVENOUS

## 2011-04-04 MED ORDER — ONDANSETRON HCL 4 MG/2ML IJ SOLN
INTRAMUSCULAR | Status: DC | PRN
  Administered 2011-04-04: 4 mg via INTRAVENOUS

## 2011-04-04 MED ORDER — LACTATED RINGERS IV SOLN
INTRAVENOUS | Status: DC | PRN
  Administered 2011-04-04 (×3): via INTRAVENOUS

## 2011-04-04 MED ORDER — DIPHENHYDRAMINE HCL 50 MG/ML IJ SOLN: 12.5000 mg | Freq: Four times a day (QID) | INTRAMUSCULAR | Status: DC | PRN

## 2011-04-04 MED ORDER — GABAPENTIN 400 MG PO CAPS
800.0000 mg | ORAL_CAPSULE | Freq: Three times a day (TID) | ORAL | Status: DC
  Administered 2011-04-04 – 2011-04-12 (×24): 800 mg via ORAL
  Filled 2011-04-04 (×25): qty 2

## 2011-04-04 MED ORDER — POTASSIUM CHLORIDE CRYS ER 20 MEQ PO TBCR: 20.0000 meq | EXTENDED_RELEASE_TABLET | Freq: Once | ORAL | Status: AC | PRN

## 2011-04-04 MED ORDER — PROTAMINE SULFATE 10 MG/ML IV SOLN
INTRAVENOUS | Status: DC | PRN
  Administered 2011-04-04: 10 mg via INTRAVENOUS
  Administered 2011-04-04: 20 mg via INTRAVENOUS

## 2011-04-04 MED ORDER — HYDROMORPHONE HCL PF 1 MG/ML IJ SOLN
INTRAMUSCULAR | Status: AC
  Filled 2011-04-04: qty 1

## 2011-04-04 MED ORDER — PROPOFOL 10 MG/ML IV EMUL
INTRAVENOUS | Status: DC | PRN
  Administered 2011-04-04: 150 mg via INTRAVENOUS
  Administered 2011-04-04: 50 mg via INTRAVENOUS

## 2011-04-04 MED ORDER — MORPHINE SULFATE (PF) 1 MG/ML IV SOLN
INTRAVENOUS | Status: DC
  Administered 2011-04-04: 3 mg via INTRAVENOUS
  Administered 2011-04-04: 1 mg via INTRAVENOUS
  Administered 2011-04-05: 16:00:00 via INTRAVENOUS
  Administered 2011-04-05: 1 mg via INTRAVENOUS
  Administered 2011-04-05: 4 mg via INTRAVENOUS
  Administered 2011-04-05: 5.85 mg via INTRAVENOUS
  Administered 2011-04-05: 2 mg via INTRAVENOUS
  Administered 2011-04-05: 6 mg via INTRAVENOUS
  Administered 2011-04-05: 2 mg via INTRAVENOUS
  Filled 2011-04-04 (×2): qty 25

## 2011-04-04 MED ORDER — METOPROLOL TARTRATE 1 MG/ML IV SOLN: 2.0000 mg | INTRAVENOUS | Status: DC | PRN

## 2011-04-04 SURGICAL SUPPLY — 81 items
BANDAGE ACE 4 STERILE (GAUZE/BANDAGES/DRESSINGS) ×3 IMPLANT
BANDAGE CONFORM 3  STR LF (GAUZE/BANDAGES/DRESSINGS) ×3 IMPLANT
BANDAGE ELASTIC 4 VELCRO ST LF (GAUZE/BANDAGES/DRESSINGS) ×3 IMPLANT
BANDAGE ESMARK 6X9 LF (GAUZE/BANDAGES/DRESSINGS) ×2 IMPLANT
BANDAGE GAUZE ELAST BULKY 4 IN (GAUZE/BANDAGES/DRESSINGS) ×3 IMPLANT
BLADE LONG MED 31X9 (MISCELLANEOUS) ×3 IMPLANT
BLADE SURG 10 STRL SS (BLADE) ×6 IMPLANT
BNDG ESMARK 6X9 LF (GAUZE/BANDAGES/DRESSINGS) ×3
CANISTER SUCTION 2500CC (MISCELLANEOUS) ×3 IMPLANT
CANNULA VESSEL W/WING WO/VALVE (CANNULA) ×3 IMPLANT
CLIP TI MEDIUM 24 (CLIP) ×3 IMPLANT
CLIP TI WIDE RED SMALL 24 (CLIP) ×6 IMPLANT
CLOTH BEACON ORANGE TIMEOUT ST (SAFETY) ×3 IMPLANT
CONT SPEC 4OZ CLIKSEAL STRL BL (MISCELLANEOUS) ×3 IMPLANT
COVER SURGICAL LIGHT HANDLE (MISCELLANEOUS) ×6 IMPLANT
CUFF TOURNIQUET SINGLE 24IN (TOURNIQUET CUFF) IMPLANT
CUFF TOURNIQUET SINGLE 34IN LL (TOURNIQUET CUFF) IMPLANT
CUFF TOURNIQUET SINGLE 44IN (TOURNIQUET CUFF) ×3 IMPLANT
DERMABOND ADVANCED (GAUZE/BANDAGES/DRESSINGS) ×2
DERMABOND ADVANCED .7 DNX12 (GAUZE/BANDAGES/DRESSINGS) ×4 IMPLANT
DRAIN CHANNEL 15F RND FF W/TCR (WOUND CARE) ×6 IMPLANT
DRAIN PENROSE 3/4X12 (DRAIN) IMPLANT
DRAPE EXTREMITY T 121X128X90 (DRAPE) IMPLANT
DRAPE WARM FLUID 44X44 (DRAPE) ×3 IMPLANT
DRAPE X-RAY CASS 24X20 (DRAPES) ×3 IMPLANT
DRSG COVADERM 4X10 (GAUZE/BANDAGES/DRESSINGS) IMPLANT
DRSG COVADERM 4X6 (GAUZE/BANDAGES/DRESSINGS) ×9 IMPLANT
DRSG COVADERM 4X8 (GAUZE/BANDAGES/DRESSINGS) ×9 IMPLANT
ELECT REM PT RETURN 9FT ADLT (ELECTROSURGICAL) ×3
ELECTRODE REM PT RTRN 9FT ADLT (ELECTROSURGICAL) ×2 IMPLANT
EVACUATOR SILICONE 100CC (DRAIN) ×6 IMPLANT
GAUZE SPONGE 4X4 12PLY STRL LF (GAUZE/BANDAGES/DRESSINGS) ×3 IMPLANT
GLOVE BIO SURGEON STRL SZ7.5 (GLOVE) ×6 IMPLANT
GLOVE BIOGEL M 6.5 STRL (GLOVE) ×6 IMPLANT
GLOVE BIOGEL PI IND STRL 6.5 (GLOVE) ×12 IMPLANT
GLOVE BIOGEL PI IND STRL 7.0 (GLOVE) ×4 IMPLANT
GLOVE BIOGEL PI IND STRL 7.5 (GLOVE) ×4 IMPLANT
GLOVE BIOGEL PI INDICATOR 6.5 (GLOVE) ×6
GLOVE BIOGEL PI INDICATOR 7.0 (GLOVE) ×2
GLOVE BIOGEL PI INDICATOR 7.5 (GLOVE) ×2
GLOVE SS BIOGEL STRL SZ 6.5 (GLOVE) ×6 IMPLANT
GLOVE SUPERSENSE BIOGEL SZ 6.5 (GLOVE) ×3
GLOVE SURG SS PI 7.5 STRL IVOR (GLOVE) ×3 IMPLANT
GOWN STRL NON-REIN LRG LVL3 (GOWN DISPOSABLE) ×18 IMPLANT
KIT BASIN OR (CUSTOM PROCEDURE TRAY) ×3 IMPLANT
KIT ROOM TURNOVER OR (KITS) ×3 IMPLANT
MARKER GRAFT CORONARY BYPASS (MISCELLANEOUS) ×3 IMPLANT
NEEDLE 18GX1X1/2 (RX/OR ONLY) (NEEDLE) ×3 IMPLANT
NS IRRIG 1000ML POUR BTL (IV SOLUTION) ×6 IMPLANT
PACK GENERAL/GYN (CUSTOM PROCEDURE TRAY) IMPLANT
PACK PERIPHERAL VASCULAR (CUSTOM PROCEDURE TRAY) ×3 IMPLANT
PAD ARMBOARD 7.5X6 YLW CONV (MISCELLANEOUS) ×6 IMPLANT
PADDING CAST COTTON 6X4 STRL (CAST SUPPLIES) ×3 IMPLANT
SET COLLECT BLD 21X3/4 12 (NEEDLE) ×3 IMPLANT
SPECIMEN JAR SMALL (MISCELLANEOUS) ×3 IMPLANT
SPONGE GAUZE 4X4 12PLY (GAUZE/BANDAGES/DRESSINGS) ×3 IMPLANT
SPONGE GAUZE 4X4 STERILE 39 (GAUZE/BANDAGES/DRESSINGS) ×3 IMPLANT
SPONGE SURGIFOAM ABS GEL 100 (HEMOSTASIS) IMPLANT
STAPLER VISISTAT 35W (STAPLE) IMPLANT
STOPCOCK 4 WAY LG BORE MALE ST (IV SETS) ×3 IMPLANT
SUT ETHILON 3 0 PS 1 (SUTURE) ×9 IMPLANT
SUT PROLENE 5 0 C 1 24 (SUTURE) ×6 IMPLANT
SUT PROLENE 6 0 BV (SUTURE) ×9 IMPLANT
SUT PROLENE 7 0 BV 1 (SUTURE) IMPLANT
SUT SILK 2 0 FS (SUTURE) ×3 IMPLANT
SUT SILK 3 0 (SUTURE) ×1
SUT SILK 3-0 18XBRD TIE 12 (SUTURE) ×2 IMPLANT
SUT VIC AB 2-0 CTB1 (SUTURE) ×6 IMPLANT
SUT VIC AB 3-0 SH 27 (SUTURE) ×3
SUT VIC AB 3-0 SH 27X BRD (SUTURE) ×6 IMPLANT
SUT VICRYL 4-0 PS2 18IN ABS (SUTURE) ×9 IMPLANT
SWAB COLLECTION DEVICE MRSA (MISCELLANEOUS) ×3 IMPLANT
SYR 20CC LL (SYRINGE) ×3 IMPLANT
SYR 3ML LL SCALE MARK (SYRINGE) ×3 IMPLANT
TOWEL OR 17X24 6PK STRL BLUE (TOWEL DISPOSABLE) ×12 IMPLANT
TOWEL OR 17X26 10 PK STRL BLUE (TOWEL DISPOSABLE) ×6 IMPLANT
TRAY FOLEY CATH 14FRSI W/METER (CATHETERS) ×3 IMPLANT
TUBE ANAEROBIC SPECIMEN COL (MISCELLANEOUS) ×3 IMPLANT
TUBING EXTENTION W/L.L. (IV SETS) ×3 IMPLANT
UNDERPAD 30X30 INCONTINENT (UNDERPADS AND DIAPERS) ×3 IMPLANT
WATER STERILE IRR 1000ML POUR (IV SOLUTION) ×3 IMPLANT

## 2011-04-04 NOTE — Anesthesia Preprocedure Evaluation (Signed)
Anesthesia Evaluation  Patient identified by MRN, date of birth, ID band Patient awake    Reviewed: Allergy & Precautions, H&P , NPO status , Patient's Chart, lab work & pertinent test results  History of Anesthesia Complications (+) AWARENESS UNDER ANESTHESIA  Airway Mallampati: II TM Distance: >3 FB Neck ROM: full    Dental  (+) Edentulous Upper and Edentulous Lower   Pulmonary          Cardiovascular hypertension, + Peripheral Vascular Disease regular Normal    Neuro/Psych Negative Neurological ROS     GI/Hepatic Neg liver ROS, GERD-  ,  Endo/Other  Diabetes mellitus-, Poorly Controlled, Insulin DependentMorbid obesity  Renal/GU   Genitourinary negative   Musculoskeletal   Abdominal   Peds  Hematology negative hematology ROS (+)   Anesthesia Other Findings   Reproductive/Obstetrics                           Anesthesia Physical Anesthesia Plan  ASA: III  Anesthesia Plan: General   Post-op Pain Management:    Induction: Intravenous  Airway Management Planned: Oral ETT  Additional Equipment:   Intra-op Plan:   Post-operative Plan:   Informed Consent: I have reviewed the patients History and Physical, chart, labs and discussed the procedure including the risks, benefits and alternatives for the proposed anesthesia with the patient or authorized representative who has indicated his/her understanding and acceptance.     Plan Discussed with: CRNA, Anesthesiologist and Surgeon  Anesthesia Plan Comments:         Anesthesia Quick Evaluation

## 2011-04-04 NOTE — Preoperative (Signed)
Beta Blockers   Reason not to administer Beta Blockers:Not Applicable 

## 2011-04-04 NOTE — Transfer of Care (Signed)
Immediate Anesthesia Transfer of Care Note  Patient: Pamela [name]  Procedure(s) Performed:  BYPASS GRAFT FEMORAL-POPLITEAL ARTERY - left femoral-above knee popliteal bypass graft using left  non reverse greater saphenous vein; AMPUTATION DIGIT -  left forefoot amputation  Patient Location: PACU  Anesthesia Type: General  Level of Consciousness: sedated  Airway & Oxygen Therapy: Patient Spontanous Breathing and Patient connected to face mask oxygen  Post-op Assessment: Report given to PACU RN and Patient moving all extremities X 4  Post vital signs: Reviewed and stable  Complications: No apparent anesthesia complications

## 2011-04-04 NOTE — Op Note (Signed)
NAMENyhla [name]   MRN: [id number] DOB: 11-29-1950    DATE OF OPERATION: 04/04/2011  PREOP DIAGNOSIS: Diabetic foot infection left foot with left superficial femoral artery occlusive disease.  POSTOP DIAGNOSIS: Same  PROCEDURE:  1. Left femoral to above-knee popliteal artery bypass with nonreversible translocated saphenous vein graft 2. Intraoperative arteriogram 3. Transmetatarsal amputation left foot  SURGEON: Di Kindle. Edilia Bo, MD, FACS  ASSIST: Newton Pigg PA  ANESTHESIA: Gen.   EBL: 200 cc  INDICATIONS: Pamela [name] is a 61 y.o. female who presented with a diabetic foot infection left foot. She underwent an arteriogram which showed a superficial femoral artery occlusion. It was felt that her only chance for limb salvage was attempted revascularization and amputation of the infected toes.  FINDINGS: the entire forefoot was infected. Cultures were sent. There was infection tracking along the tendon sheaths. There was a smooth plaque in the above-knee popliteal artery just above the level of the anastomosis.  TECHNIQUE: The patient was taken to the operating room and received a general anesthetic. The left lower extremity and left foot were prepped and draped in the usual sterile fashion. A transverse incision was made just above the inguinal crease. Of note her abdomen had been take superiorly because of her large pannus. Through this incision the superficial femoral artery was dissected free which was chronically occluded. The bifurcation was fairly high. There was plaque within the deep femoral artery and distal common femoral artery. I had to dissect up fairly high on the common femoral artery in order to find a soft spot in the artery with a good pulse. Through this incision the saphenofemoral junction was dissected free. Using 2 additional incisions along the medial aspect of the left leg the greater saphenous vein was harvested to the level of the knee. Branches were  divided between clips and 3-0 silk ties. Through the distal incision the above-knee popliteal artery was exposed. The level of the occlusion was identified. Below this the artery was patent although there was a smooth posterior plaque. A tunnel was created from the below-knee popliteal artery to the groin incision. The patient was then heparinized.  The saphenofemoral junction was clamped. The saphenous vein was excised from the femoral vein. The femoral vein was oversewn with a 5-0 Prolene. The proximal valve in the greater saphenous vein was sharply excised. Next the deep femoral and superficial femoral arteries were controlled with vessel loops. The common femoral artery was clamped proximally. Several side branches were controlled with vessel loops. A longitudinal arteriotomy was made in the soft area of the common femoral artery which was slightly anterior lateral. The saphenous vein was sewn end to side in a nonreversible fashion with continuous 5-0 Prolene suture. Prior to completing this anastomosis the artery was backbled and flushed there was excellent inflow. Next using the retrograde Mills valvulotome the valves were sharply excised. The graft was flushed with heparinized saline. The vein was then marked it with a twisting. It was then brought through the previously created tunnel. A tourniquet was placed on the upper thigh. The leg was exsanguinated with an Esmarch bandage. The tourniquet was inflated to 250 mm mercury. Under tourniquet control, a longitudinal arteriotomy was made in the area distal above-knee popliteal artery. The vein was cut to the appropriate length spatulated and sewn end-to-side to the distal above-knee popliteal artery using 2 continuous 6-0 Prolene sutures parachuting both the heel and toe of the anastomosis. Prior to completing the anastomosis the tourniquet was released. The artery was  backbled and flushed appropriately and the anastomosis completed. Then there was good flow  established to the left foot. An intraoperative arteriogram was obtained which showed good runoff and no technical problems. The smooth plaque in the popliteal artery was identified.  Hemostasis was then obtained in the wounds and the heparin was partially reversed with protamine. A 15 Blake drain was placed in the distal incision and also in the groin incision. The groin incision was closed with 2 deep layers of 2-0 Vicryl. The subcuticular layer was closed with 4-0 Vicryl. The thigh incisions were closed with a deep layer of 2-0 Vicryl. The subcutaneous layer of 3-0 Vicryl. And the skin closed with 40 subcutaneous stitch. Dermabond was applied and sterile dressings were applied  Attention was then turned to the left foot. The incision was made encompassing all the necrotic tissue on the skin and it was clearly an extensive deep space infection which could not be appreciated on exam. Therefore I had to perform a transmetatarsal amputation. The incision was extended to encompass the first metatarsal head the periosteum was elevated the metatarsals were then divided using the TPS saw. The sesamoid bones were removed. Hemostasis was obtained in the wound. I was able to close the medial area loosely with 3-0 Vicryl to the deep layer and then over the metatarsal head with 3 interrupted 30 nylons. The remainder of the wound was left open. Moistened 4 x 4's were placed and a sterile dressing applied. At the completion was an excellent anterior tibial and posterior tibial signal with the Doppler.  All needle and sponge counts were correct. The patient was transferred to the recovery room in stable condition.   Waverly Ferrari, MD, FACS [facility]  DATE OF DICTATION:   04/04/2011

## 2011-04-04 NOTE — Progress Notes (Signed)
Patient ID: Pamela [name], female   DOB: 1950/04/24, 61 y.o.   MRN: [id number] SUBJECTIVE: Complains of left foot pain. No chest pain or shortness of breath.  Principal Problem:  *Diabetic foot ulcer Active Problems:  Diabetes mellitus  Obesities, morbid  Hyponatremia  HTN (hypertension)  GERD (gastroesophageal reflux disease)  Gout  CKD (chronic kidney disease) stage 3, GFR 30-59 ml/min  PAD (peripheral artery disease)  Preop cardiovascular exam   LABS: Basic Metabolic Panel:  [facility] 04/01/11 0959  NA 135  K 4.1  CL 102  CO2 23  GLUCOSE 110*  BUN 15  CREATININE 1.25*  CALCIUM 8.6  MG --  PHOS --   CBC:  Basename 04/01/11 0959  WBC 10.6*  NEUTROABS --  HGB 9.5*  HCT 29.3*  MCV 95.1  PLT 246   Echo 03/31/2011 Study Conclusions - Left ventricle: The cavity size was normal. Wall thickness was increased in a pattern of mild LVH. Systolic function was normal. The estimated ejection fraction was in the range of 55% to 60%. Wall motion was normal; there were no regional wall motion abnormalities. Features are consistent with a pseudonormal left ventricular filling pattern, with concomitant abnormal relaxation and increased filling pressure (grade 2 diastolic dysfunction). - Mitral valve: Trivial regurgitation. - Left atrium: The atrium was mildly dilated. - Right ventricle: The cavity size was normal. Systolic function was normal. - Right atrium: The atrium was mildly dilated. - Pulmonary arteries: No complete TR doppler jet so unable to estimate PA systolic pressure. - Systemic veins: IVC is dilated to 3 cm with some respirophasic variation, suggesting RA pressure at least 15 mmHg. Impressions:  Aortogram with bilateral iliac arteriogram 04/01/2011 FINDINGS:  1. There are single renal arteries bilaterally with no significant renal artery stenosis noted.  2. There is no significant occlusive disease or stenosis in the aorta, bilateral common iliac  arteries, bilateral external iliac arteries, and bilateral hypogastric arteries.  3. On the left side, the common femoral and deep femoral artery are patent. The superficial femoral artery is occluded at its origin with reconstitution of the distal above-knee popliteal artery. On the left side there is 3 vessel runoff via the anterior tibial, posterior tibial, and peroneal arteries.  4. On the right side, the common femoral and deep femoral artery are patent. The superficial femoral artery is occluded at its origin with reconstitution of the above-knee popliteal artery. The proximal tibial vessels are patent. The peroneal is not visualized distally. The anterior tibial and posterior tibial arteries are patent.    No results found for this basename: HGBA1C in the last 72 hours Fasting Lipid Panel:  Basename 04/02/11 0443  CHOL 174  HDL 20*  LDLCALC 115*  TRIG 196*  CHOLHDL 8.7  LDLDIRECT --     PHYSICAL EXAM BP 131/72  Pulse 62  Temp(Src) 97.2 F (36.2 C) (Oral)  Resp 15  Ht 5\' 6"  (1.676 m)  Wt 110.7 kg (244 lb 0.8 oz)  BMI 39.39 kg/m2  SpO2 97% General: Well developed, well nourished, in no acute distress Head: Eyes PERRLA, No xanthomas.   Normal cephalic and atramatic  Lungs: Clear bilaterally to auscultation and percussion. Heart: HRRR S1 S2, Distant sounds. No MRG .  Pulses are 2+ & equal.            No carotid bruit. No JVD.  No abdominal bruits. No femoral bruits. Abdomen: Bowel sounds are positive, abdomen soft and non-tender without masses or  Hernia's noted. Msk:  Back normal, normal gait. Normal strength and tone for age. Extremities: No clubbing, cyanosis or edema.  DP +1 Neuro: Alert and oriented X 3. Psych:  Good affect, responds appropriately  TELEMETRY: Reviewed telemetry pt in SR/SB  ASSESSMENT AND PLAN: 1. PAD: Surgery planned today. She has been cleared by cardiology to proceed . Continue meds as ordered. Labs are pending for this am.Will  be available as needed peri-operatively. 2. HTN:  Well controlld continue current meds

## 2011-04-04 NOTE — Anesthesia Procedure Notes (Signed)
Procedure Name: Intubation Date/Time: 04/04/2011 9:41 AM Performed by: Carmela Rima Pre-anesthesia Checklist: Patient identified, Timeout performed, Emergency Drugs available, Suction available and Patient being monitored Patient Re-evaluated:Patient Re-evaluated prior to inductionOxygen Delivery Method: Circle System Utilized Preoxygenation: Pre-oxygenation with 100% oxygen Intubation Type: IV induction Ventilation: Mask ventilation without difficulty Laryngoscope Size: Mac and 3 Grade View: Grade I Tube type: Oral Tube size: 7.5 mm Number of attempts: 1 Placement Confirmation: ETT inserted through vocal cords under direct vision,  breath sounds checked- equal and bilateral,  positive ETCO2 and CO2 detector Secured at: 22 cm Tube secured with: Tape Dental Injury: Teeth and Oropharynx as per pre-operative assessment

## 2011-04-04 NOTE — Progress Notes (Signed)
Subjective: This is a 61 y/o female with diabetes who was admitted for DKA and an infected left foot ulcer with gangrenous changes of the the 5th toe  She has undergone a Left fem-pop bypass and transmetatarsal amputation.  She is noted to have twitching in left arm and states that it has been occurring on and off for months. She has no pain or sensory changes in the arm. I have also noted that her right had has pitting edema today which she has not noticed.  Objective: Blood pressure 136/60, pulse 81, temperature 98.2 F (36.8 C), temperature source Oral, resp. rate 15, height 5\' 6"  (1.676 m), weight 110.7 kg (244 lb 0.8 oz), SpO2 97.00%. Weight change: 1 kg (2 lb 3.3 oz)  Intake/Output Summary (Last 24 hours) at 04/04/11 1728 Last data filed at 04/04/11 1600  Gross per 24 hour  Intake   3030 ml  Output   1700 ml  Net   1330 ml    Physical Exam: General appearance: alert, cooperative and no distress Head: Normocephalic, without obvious abnormality, atraumatic Eyes: conjunctivae/corneas clear. PERRL, EOM's intact.  Throat: lips, mucosa, and tongue normal Lungs: clear to auscultation bilaterally Heart: regular rate and rhythm, S1, S2 normal, no murmur, click, rub or gallop Abdomen: soft, non-tender; bowel sounds normal; no masses,  no organomegaly Extremities: extremities normal, atraumatic, no cyanosis ; edema of right hand and jerking of right arm.  Skin: left foot heavily bandaged.  Neurologic: Grossly normal  Lab Results: No results found for this basename: NA:2,K:2,CL:2,CO2:2,GLUCOSE:2,BUN:2,CREATININE:2,CALCIUM:2,MG:2,PHOS:2 in the last 72 hours No results found for this basename: AST:2,ALT:2,ALKPHOS:2,BILITOT:2,PROT:2,ALBUMIN:2 in the last 72 hours No results found for this basename: LIPASE:2,AMYLASE:2 in the last 72 hours No results found for this basename: WBC:2,NEUTROABS:2,HGB:2,HCT:2,MCV:2,PLT:2 in the last 72 hours No results found for this basename:  CKTOTAL:3,CKMB:3,CKMBINDEX:3,TROPONINI:3 in the last 72 hours No components found with this basename: POCBNP:3 No results found for this basename: DDIMER:2 in the last 72 hours No results found for this basename: HGBA1C:2 in the last 72 hours  Basename 04/02/11 0443  CHOL 174  HDL 20*  LDLCALC 115*  TRIG 196*  CHOLHDL 8.7  LDLDIRECT --   No results found for this basename: TSH,T4TOTAL,FREET3,T3FREE,THYROIDAB in the last 72 hours No results found for this basename: VITAMINB12:2,FOLATE:2,FERRITIN:2,TIBC:2,IRON:2,RETICCTPCT:2 in the last 72 hours  Micro Results: Recent Results (from the past 240 hour(s))  MRSA PCR SCREENING     Status: Normal   Collection Time   03/30/11  3:07 AM      Component Value Range Status Comment   MRSA by PCR NEGATIVE  NEGATIVE  Final     Studies/Results: Dg Foot Complete Left  03/29/2011  *RADIOLOGY REPORT*  Clinical Data: Diabetic ulcer the base of the fifth toe.  LEFT FOOT - COMPLETE 3+ VIEW  Comparison: None.  Findings: Soft tissue swelling and soft tissue defect in the lateral aspect of the forefoot over the ankle metatarsal phalangeal region.  There is a suggestion of small gas bubbles in the subcutaneous tissues which might represent soft tissue abscess or infection.  The underlying bones appear intact without evidence of cortical erosion or sclerosis.  No obvious changes of osteomyelitis.  There is thickening of the bone cortex along the shaft of the fourth metatarsal bone which probably represents old healed fracture or stress reaction.  Mild degenerative changes in the intertarsal and tarsometatarsal joints.  Accessory navicular ossicle.  Small plantar calcaneal spur.  Vascular calcifications.  IMPRESSION: Soft tissue defect, swelling, and soft tissue gas  in the lateral aspect of the left forefoot consistent with soft tissue infection. No specific evidence of osteomyelitis.  Cortical thickening along the shaft of the fourth metatarsal bone probably  representing old fracture deformity or stress reaction.  Degenerative changes in the tarsal joints.  Original Report Authenticated By: Marlon Pel, M.D.    Medications: Scheduled Meds:    . allopurinol  300 mg Oral Daily  . amLODipine  10 mg Oral Daily  . aspirin EC  81 mg Oral Daily  . cefUROXime (ZINACEF)  IV  1.5 g Intravenous Q12H  . docusate sodium  100 mg Oral Daily  . famotidine (PEPCID) IV  20 mg Intravenous Q12H  . gabapentin  800 mg Oral TID  . HYDROmorphone      . imipenem-cilastatin  500 mg Intravenous Q8H  . insulin aspart  0-15 Units Subcutaneous TID WC  . insulin aspart  0-5 Units Subcutaneous QHS  . insulin glargine  60 Units Subcutaneous BID  . metoprolol tartrate  12.5 mg Oral BID  . morphine   Intravenous Q4H  . vancomycin  1,000 mg Intravenous Q12H  . DISCONTD: collagenase   Topical BID  . DISCONTD: cyclobenzaprine  10 mg Oral TID  . DISCONTD: losartan  100 mg Oral Daily  . DISCONTD: metoprolol tartrate  12.5 mg Oral BID  . DISCONTD: omega-3 acid ethyl esters  4 g Oral Daily  . DISCONTD: simvastatin  20 mg Oral q1800  . DISCONTD: vitamin B-12  100 mcg Oral Daily  . DISCONTD: Vitamin D (Ergocalciferol)  50,000 Units Oral Weekly   Continuous Infusions:    . 0.9 % NaCl with KCl 20 mEq / L    . DOPamine     PRN Meds:.sodium chloride, acetaminophen, acetaminophen, acetaminophen, alum & mag hydroxide-simeth, cyclobenzaprine, diphenhydrAMINE, diphenhydrAMINE, guaiFENesin-dextromethorphan, hydrALAZINE, labetalol, magnesium sulfate 1 - 4 g bolus IVPB, metoprolol, naloxone, ondansetron (ZOFRAN) IV, ondansetron, ondansetron (ZOFRAN) IV, ondansetron, phenol, potassium chloride, sodium chloride, zolpidem, DISCONTD: 0.9 % irrigation (POUR BTL), DISCONTD: dextrose DISCONTD: heparin 6000 unit irrigation, DISCONTD: HYDROmorphone, DISCONTD: HYDROmorphone, DISCONTD: hydrOXYzine, DISCONTD: iohexol, DISCONTD: ondansetron (ZOFRAN) IV, DISCONTD: oxyCODONE, DISCONTD:  papaverine, DISCONTD: promethazine  Assessment/Plan:  Diabetic foot ulcer/ leukocytosis/ fevers - Was initially being managed with Zosyn and VAncomycin. Due to continued fevers, I have changed her Zosyn to Imipenem. Fevers have resolved for the time being and leukocytosis continues to improve. S/p left fem-pop and and transmetatarsal amputation.    Diabetes mellitus- sugars noted to be extremely uncontrolled on admission although the patient states usually they are quite well controlled.  They have steadily improved and today are ranging below 150.   She is on a high dose sliding scale and thus for seems to be tolerating it. Will continue for now. In the next 24-48 hrs as her infection further comes under control, we may have to downgrade her to a more moderate scale.    Hypernatremia- likely from dehydration secondary to uncontrolled sugars. The HCTZ that she takes is probably contributing as well.  I have now stopped IVF.    HTN (hypertension)- resumed Norvasc. Hold ARB and HCTZ for now. Will monitor BP closely while on Morphine drip.   Jerking right arm- Will try Flexeril which is what she takes at home when this occurs.  Edema right hand- cont to monitor.   GERD (gastroesophageal reflux disease)  Gout  CKD (chronic kidney disease) stage 3, GFR 30-59 ml/min- no baseline creatinine to compare this with.  Obesity, morbid    LOS: 6  days   [facility] [phone] 04/04/2011, 5:28 PM

## 2011-04-04 NOTE — Anesthesia Postprocedure Evaluation (Signed)
  Anesthesia Post-op Note  Patient: Pamela [name]  Procedure(s) Performed:  BYPASS GRAFT FEMORAL-POPLITEAL ARTERY - left femoral-above knee popliteal bypass graft using left  non reverse greater saphenous vein; AMPUTATION DIGIT -  left forefoot amputation  Patient Location: PACU  Anesthesia Type: General  Level of Consciousness: awake, alert  and oriented  Airway and Oxygen Therapy: Patient Spontanous Breathing and Patient connected to nasal cannula oxygen  Post-op Pain: mild  Post-op Assessment: Post-op Vital signs reviewed, Patient's Cardiovascular Status Stable, Respiratory Function Stable, Patent Airway, No signs of Nausea or vomiting and Pain level controlled  Post-op Vital Signs: Reviewed and stable  Complications: No apparent anesthesia complications

## 2011-04-04 NOTE — H&P (View-Only) (Signed)
VASCULAR PROGRESS NOTE  SUBJECTIVE: No complaints this morning.  PHYSICAL EXAM: Filed Vitals:   04/02/11 2327 04/03/11 0000 04/03/11 0314 04/03/11 0319  BP: 112/53   114/59  Pulse: 65 63  68  Temp: 100.7 F (38.2 C)   98.8 F (37.1 C)  TempSrc: Oral   Oral  Resp: 14 14  23   Height:      Weight:   241 lb 13.5 oz (109.7 kg)   SpO2: 93% 95%  95%   Lungs: clear to auscultation Left foot unchanged. Palpable left femoral pulse.  LABS: Lab Results  Component Value Date   WBC 10.6* 04/01/2011   HGB 9.5* 04/01/2011   HCT 29.3* 04/01/2011   MCV 95.1 04/01/2011   PLT 246 04/01/2011   Lab Results  Component Value Date   CREATININE 1.25* 04/01/2011   CBG (last 3)   Basename 04/02/11 2122 04/02/11 1648 04/02/11 1227  GLUCAP 200* 162* 144*   ASSESSMENT/PLAN: 1. For a left femoropopliteal bypass and amputation of the left fifth, possible left fourth, or possible forefoot amputation tomorrow. I have previously discussed the procedure and potential complications. 2. Vein map was done but results not yet document in EPIC.  Waverly Ferrari, MD, FACS Beeper: [phone] 04/03/2011

## 2011-04-04 NOTE — OR Nursing (Signed)
End time for Fem Pop: 1330 Start Time for Amputation: 1334

## 2011-04-04 NOTE — Interval H&P Note (Signed)
History and Physical Interval Note:  04/04/2011 9:19 AM  Pamela [name]  has presented today for surgery, with the diagnosis of peripheral vascular disease w gangrene of left 5th toe; cellulitis dorsum left foot   The various methods of treatment have been discussed with the patient and family. After consideration of risks, benefits and other options for treatment, the patient has consented to: LEFT FEMPOP BYPASS GRAFT AND 5TH TOE AMPUTATATION, POSSIBLE 4TH TOE AMPUTATION, POSSIBLE FOREFOOT AMPUTATION. The patients' history has been reviewed, patient examined, no change in status, stable for surgery.  I have reviewed the patients' chart and labs.  Questions were answered to the patient's satisfaction.     Griffen Frayne S

## 2011-04-05 ENCOUNTER — Encounter (HOSPITAL_COMMUNITY): Payer: Self-pay | Admitting: Vascular Surgery

## 2011-04-05 LAB — BASIC METABOLIC PANEL
BUN: 18 mg/dL (ref 6–23)
CO2: 24 mEq/L (ref 19–32)
CO2: 26 mEq/L (ref 19–32)
Calcium: 9 mg/dL (ref 8.4–10.5)
Chloride: 102 mEq/L (ref 96–112)
Creatinine, Ser: 1.4 mg/dL — ABNORMAL HIGH (ref 0.50–1.10)
GFR calc Af Amer: 46 mL/min — ABNORMAL LOW (ref >90)
Glucose, Bld: 155 mg/dL — ABNORMAL HIGH (ref 70–99)
Potassium: 5.5 mEq/L — ABNORMAL HIGH (ref 3.5–5.1)

## 2011-04-05 LAB — GLUCOSE, CAPILLARY
Glucose-Capillary: 62 mg/dL — ABNORMAL LOW (ref 70–99)
Glucose-Capillary: 105 mg/dL — ABNORMAL HIGH (ref 70–99)

## 2011-04-05 LAB — CBC
HCT: 24 % — ABNORMAL LOW (ref 36.0–46.0)
Hemoglobin: 7.6 g/dL — ABNORMAL LOW (ref 12.0–15.0)
WBC: 10.3 10*3/uL (ref 4.0–10.5)

## 2011-04-05 MED ORDER — DOCUSATE SODIUM 100 MG PO CAPS
100.0000 mg | ORAL_CAPSULE | Freq: Two times a day (BID) | ORAL | Status: DC
  Administered 2011-04-05 – 2011-04-12 (×15): 100 mg via ORAL
  Filled 2011-04-05 (×17): qty 1

## 2011-04-05 MED ORDER — INSULIN ASPART 100 UNIT/ML ~~LOC~~ SOLN
0.0000 [IU] | Freq: Three times a day (TID) | SUBCUTANEOUS | Status: DC
  Administered 2011-04-06 – 2011-04-09 (×4): 2 [IU] via SUBCUTANEOUS
  Administered 2011-04-10: 3 [IU] via SUBCUTANEOUS
  Administered 2011-04-11: 2 [IU] via SUBCUTANEOUS
  Administered 2011-04-11: 3 [IU] via SUBCUTANEOUS
  Filled 2011-04-05: qty 3

## 2011-04-05 NOTE — Progress Notes (Signed)
VASCULAR PROGRESS NOTE  SUBJECTIVE: Complains of pain from sitting. Pain in the foot adequately controlled.  PHYSICAL EXAM: Filed Vitals:   04/04/11 2341 04/05/11 0038 04/05/11 0408 04/05/11 0455  BP: 131/54  122/56   Pulse: 92  83   Temp: 98.7 F (37.1 C)  100.4 F (38 C)   TempSrc: Oral  Oral   Resp: 19 20 19 19   Height:      Weight:      SpO2: 92% 94% 92% 95%   Lungs: clear to auscultation Excellent Doppler flow in the left foot in the dorsalis pedis and posterior tibial positions. The wound was inspected. There is minimal drainage. It appears well perfused.  LABS: Lab Results  Component Value Date   WBC 10.3 04/05/2011   HGB 7.6* 04/05/2011   HCT 24.0* 04/05/2011   MCV 95.6 04/05/2011   PLT 428* 04/05/2011   Lab Results  Component Value Date   CREATININE 1.40* 04/05/2011   Lab Results  Component Value Date   INR 1.09 04/03/2011   CBG (last 3)   Basename 04/04/11 2154 04/04/11 1754 04/04/11 1450  GLUCAP 230* 72 71     ASSESSMENT/PLAN:  1. 1 Day Post-Op s/p: LEFT FEMORAL-POPLITEAL BYPASS AND TMA. Graft is patent with excellent perfusion to the left foot. 2. Pulse lavage to open TMA 6 times a week 3. Physical therapy: Partial weight-bearing left lower extremity, heel only, with Darco shoe. 4. From our standpoint could probably go to 2000 tomorrow. 5. Continue intravenous antibiotics  Waverly Ferrari, MD, FACS Beeper: [phone] 04/05/2011

## 2011-04-05 NOTE — Progress Notes (Signed)
Clinical social worker aware of potential disposition needs, awaiting pt evaluation.   Catha Gosselin, Theresia Majors  [phone] .04/05/2011 17:36pm

## 2011-04-05 NOTE — Progress Notes (Signed)
Pharmacy Consult - Vancomycin  Admit with DKA and infected foot ulcer with gangrenous toes  D7 of Vanc / Zosyn changed to Primaxin per RX for diabetic foot ulcer.  Afebrile currently,  WBC ok  Arteriogram completed-all arteries are patent. No cx noted. Vasc recc- left femoropopliteal bypass grafting with foot amputation (Pod 1)   Plan 1. Vancomycin 1 Gram IV Q 12 hours 2. Primaxin 500 mg IV Q 8 hours 2. Obtain trough level tomorrow  Thank you.  Okey Regal, PharmD [phone]

## 2011-04-05 NOTE — Plan of Care (Signed)
Problem: Phase II Progression Outcomes Goal: CBGs stable on SQ insulin Outcome: Progressing CBG this am was 62, patient ate breakfast plus a extra orange juice, CBG went to 105.

## 2011-04-05 NOTE — Progress Notes (Signed)
Physical Therapy Wound Treatment Patient Details  Name: Pamela [name] MRN: [id number] Date of Birth: Jan 02, 1951  Today's Date: 04/05/2011 Time: 7829-5621 Time Calculation (min): 25 min  Subjective  Subjective: "I'm hungry" Patient and Family Stated Goals: Not stated.  Pt sleeping mostly Date of Onset: 04/04/11 (transmet amputation)  Pain Score: Pain Score:   6  Wound Assessment                                                                      Wound 04/05/11 Amputation Foot Left open incision of transmetatarsal amputation (Active)  Site / Wound Assessment Red;Yellow 04/05/2011  3:30 PM  Peri-wound Assessment Intact 04/05/2011  3:30 PM  Wound Length (cm) 4 cm 04/05/2011  3:30 PM  Wound Width (cm) 12 cm 04/05/2011  3:30 PM  Wound Depth (cm) 1 cm 04/05/2011  3:30 PM  Treatment Hydrotherapy (Pulse lavage) 04/05/2011  3:30 PM  Dressing Type Moist to dry;Gauze (Comment);Compression wrap 04/05/2011  3:30 PM  Dressing Changed Changed 04/05/2011  3:30 PM     Hydrotherapy Pulsed lavage therapy - wound location: lt. transmetatarsal incision Pulsed Lavage with Suction (psi):  (48psi) Pulsed Lavage with Suction - Normal Saline Used: 1000 mL Pulsed Lavage Tip: Tip with splash shield   Wound Assessment and Plan  Wound Therapy - Assess/Plan/Recommendations Wound Therapy - Clinical Statement: Pt presents with open transmet amputation that can benefit from hydrotherapy to cleanse necrotic tissue and promote healing. Wound Therapy - Functional Problem List: Decr mobility Factors Delaying/Impairing Wound Healing: Immobility;Multiple medical problems;Polypharmacy Wound Therapy - Frequency: 6X / week Wound Therapy - Follow Up Recommendations:  (depends on dc plans)  Wound Therapy Goals- Improve the function of patient's integumentary system by progressing the wound(s) through the phases of wound healing (inflammation - proliferation - remodeling) by: Decrease Necrotic Tissue to: 20% Decrease  Necrotic Tissue - Progress: Not met Increase Granulation Tissue to: 80% Increase Granulation Tissue - Progress: Not met  Goals will be updated until maximal potential achieved or discharge criteria met.  Discharge criteria: when goals achieved, discharge from hospital, MD decision/surgical intervention, no progress towards goals, refusal/missing three consecutive treatments without notification or medical reason.  Gerlad Pelzel 04/05/2011, 3:40 PM  [facility] PT [phone]

## 2011-04-05 NOTE — Plan of Care (Signed)
Problem: Phase II Progression Outcomes Goal: Progress activity as tolerated unless otherwise ordered Outcome: Progressing Patient was a 2 person assist from the chair to the bed today.

## 2011-04-05 NOTE — Progress Notes (Signed)
Subjective: This is a 61 y/o female with diabetes who was admitted for DKA and an infected left foot ulcer with gangrenous changes of the the 5th toeShe has undergone a Left fem-pop bypass and transmetatarsal amputation.   Jerking of arm resolved with a dose of Flexeril. No new complaints.   Objective: Blood pressure 121/54, pulse 71, temperature 98.1 F (36.7 C), temperature source Oral, resp. rate 21, height 5\' 6"  (1.676 m), weight 110.7 kg (244 lb 0.8 oz), SpO2 98.00%. Weight change:   Intake/Output Summary (Last 24 hours) at 04/05/11 1615 Last data filed at 04/05/11 1200  Gross per 24 hour  Intake   2050 ml  Output   1185 ml  Net    865 ml    Physical Exam: General appearance: alert, cooperative and no distress Head: Normocephalic, without obvious abnormality, atraumatic Eyes: conjunctivae/corneas clear. PERRL, EOM's intact.  Throat: lips, mucosa, and tongue normal Lungs: clear to auscultation bilaterally Heart: regular rate and rhythm, S1, S2 normal, no murmur, click, rub or gallop Abdomen: soft, non-tender; bowel sounds normal; no masses,  no organomegaly Extremities: extremities normal, atraumatic, no cyanosis ; edema of right hand and jerking of right arm.  Skin: left foot heavily bandaged.  Neurologic: Grossly normal  Lab Results:  Basename 04/05/11 0330  NA 132*  K 5.5*  CL 102  CO2 24  GLUCOSE 155*  BUN 18  CREATININE 1.40*  CALCIUM 8.6  MG --  PHOS --   No results found for this basename: AST:2,ALT:2,ALKPHOS:2,BILITOT:2,PROT:2,ALBUMIN:2 in the last 72 hours No results found for this basename: LIPASE:2,AMYLASE:2 in the last 72 hours  Basename 04/05/11 0330  WBC 10.3  NEUTROABS --  HGB 7.6*  HCT 24.0*  MCV 95.6  PLT 428*   No results found for this basename: CKTOTAL:3,CKMB:3,CKMBINDEX:3,TROPONINI:3 in the last 72 hours No components found with this basename: POCBNP:3 No results found for this basename: DDIMER:2 in the last 72 hours No results found  for this basename: HGBA1C:2 in the last 72 hours No results found for this basename: CHOL:2,HDL:2,LDLCALC:2,TRIG:2,CHOLHDL:2,LDLDIRECT:2 in the last 72 hours No results found for this basename: TSH,T4TOTAL,FREET3,T3FREE,THYROIDAB in the last 72 hours No results found for this basename: VITAMINB12:2,FOLATE:2,FERRITIN:2,TIBC:2,IRON:2,RETICCTPCT:2 in the last 72 hours  Micro Results: Recent Results (from the past 240 hour(s))  MRSA PCR SCREENING     Status: Normal   Collection Time   03/30/11  3:07 AM      Component Value Range Status Comment   MRSA by PCR NEGATIVE  NEGATIVE  Final   WOUND CULTURE     Status: Normal (Preliminary result)   Collection Time   04/04/11  1:58 PM      Component Value Range Status Comment   Specimen Description WOUND FOOT LEFT   Final    Special Requests SWAB FROM LEFT FIFTH TOE   Final    Gram Stain PENDING   Incomplete    Culture Culture reincubated for better growth   Final    Report Status PENDING   Incomplete   ANAEROBIC CULTURE     Status: Normal (Preliminary result)   Collection Time   04/04/11  1:58 PM      Component Value Range Status Comment   Specimen Description WOUND FOOT LEFT   Final    Special Requests SWAB FROM LEFT FIFTH TOE   Final    Gram Stain     Final    Value: NO WBC SEEN     MODERATE SQUAMOUS EPITHELIAL CELLS PRESENT  RARE GRAM POSITIVE RODS   Culture     Final    Value: NO ANAEROBES ISOLATED; CULTURE IN PROGRESS FOR 5 DAYS   Report Status PENDING   Incomplete     Studies/Results: Dg Foot Complete Left  03/29/2011  *RADIOLOGY REPORT*  Clinical Data: Diabetic ulcer the base of the fifth toe.  LEFT FOOT - COMPLETE 3+ VIEW  Comparison: None.  Findings: Soft tissue swelling and soft tissue defect in the lateral aspect of the forefoot over the ankle metatarsal phalangeal region.  There is a suggestion of small gas bubbles in the subcutaneous tissues which might represent soft tissue abscess or infection.  The underlying bones appear  intact without evidence of cortical erosion or sclerosis.  No obvious changes of osteomyelitis.  There is thickening of the bone cortex along the shaft of the fourth metatarsal bone which probably represents old healed fracture or stress reaction.  Mild degenerative changes in the intertarsal and tarsometatarsal joints.  Accessory navicular ossicle.  Small plantar calcaneal spur.  Vascular calcifications.  IMPRESSION: Soft tissue defect, swelling, and soft tissue gas in the lateral aspect of the left forefoot consistent with soft tissue infection. No specific evidence of osteomyelitis.  Cortical thickening along the shaft of the fourth metatarsal bone probably representing old fracture deformity or stress reaction.  Degenerative changes in the tarsal joints.  Original Report Authenticated By: Marlon Pel, M.D.    Medications: Scheduled Meds:    . allopurinol  300 mg Oral Daily  . amLODipine  10 mg Oral Daily  . aspirin EC  81 mg Oral Daily  . docusate sodium  100 mg Oral BID  . famotidine (PEPCID) IV  20 mg Intravenous Q12H  . gabapentin  800 mg Oral TID  . HYDROmorphone      . imipenem-cilastatin  500 mg Intravenous Q8H  . insulin aspart  0-15 Units Subcutaneous TID WC  . insulin aspart  0-5 Units Subcutaneous QHS  . insulin glargine  60 Units Subcutaneous BID  . metoprolol tartrate  12.5 mg Oral BID  . morphine   Intravenous Q4H  . vancomycin  1,000 mg Intravenous Q12H  . DISCONTD: cefUROXime (ZINACEF)  IV  1.5 g Intravenous Q12H  . DISCONTD: collagenase   Topical BID  . DISCONTD: cyclobenzaprine  10 mg Oral TID  . DISCONTD: docusate sodium  100 mg Oral Daily  . DISCONTD: gabapentin  800 mg Oral TID  . DISCONTD: insulin aspart  0-15 Units Subcutaneous TID WC  . DISCONTD: losartan  100 mg Oral Daily  . DISCONTD: metoprolol tartrate  12.5 mg Oral BID  . DISCONTD: omega-3 acid ethyl esters  4 g Oral Daily  . DISCONTD: simvastatin  20 mg Oral q1800  . DISCONTD: vitamin B-12  100  mcg Oral Daily  . DISCONTD: Vitamin D (Ergocalciferol)  50,000 Units Oral Weekly   Continuous Infusions:    . DISCONTD: 0.9 % NaCl with KCl 20 mEq / L 75 mL/hr at 04/04/11 1816  . DISCONTD: DOPamine     PRN Meds:.sodium chloride, acetaminophen, acetaminophen, alum & mag hydroxide-simeth, cyclobenzaprine, diphenhydrAMINE, diphenhydrAMINE, guaiFENesin-dextromethorphan, magnesium sulfate 1 - 4 g bolus IVPB, naloxone, ondansetron (ZOFRAN) IV, ondansetron, phenol, potassium chloride, sodium chloride, zolpidem, DISCONTD: acetaminophen, DISCONTD: dextrose, DISCONTD: hydrALAZINE, DISCONTD: HYDROmorphone, DISCONTD: HYDROmorphone, DISCONTD: hydrOXYzine DISCONTD: labetalol, DISCONTD: metoprolol, DISCONTD: ondansetron (ZOFRAN) IV, DISCONTD: ondansetron, DISCONTD: ondansetron (ZOFRAN) IV, DISCONTD: oxyCODONE, DISCONTD: promethazine  Assessment/Plan:  Diabetic foot ulcer/ leukocytosis/ fevers - Was initially being managed with Zosyn and VAncomycin. Due  to continued fevers, I have changed her Zosyn to Imipenem (1/3). Fevers have resolved for the time being and leukocytosis continues to improve. S/p left fem-pop and and transmetatarsal amputation on 1/3 performed by Dr Edilia Bo.    Diabetes mellitus- sugars noted to be extremely uncontrolled on admission although the patient states usually they are quite well controlled.  They have steadily improved and today are ranging below 150.   Will switch her high dose sliding scale to moderate.    Hypernatremia- likely from dehydration secondary to uncontrolled sugars. The HCTZ that she takes is probably contributing as well.  I have now stopped IVF.   Hyperkalemia- Will recheck to ensure that it does not continue to rise.    HTN (hypertension)- resumed Norvasc. BP stable. Hold ARB and HCTZ for now. Will monitor BP closely while on Morphine drip.   Jerking right arm- Improved with Flexeril which is what she takes at home when this occurs.  Edema right hand-  improving with elevation and likely from IVF>   GERD (gastroesophageal reflux disease)  Gout  CKD (chronic kidney disease) stage 3, GFR 30-59 ml/min- no baseline creatinine to compare this with.  Obesity, morbid    LOS: 7 days   [facility] [phone] 04/05/2011, 4:15 PM

## 2011-04-06 LAB — GLUCOSE, CAPILLARY
Glucose-Capillary: 132 mg/dL — ABNORMAL HIGH (ref 70–99)
Glucose-Capillary: 89 mg/dL (ref 70–99)

## 2011-04-06 LAB — VANCOMYCIN, TROUGH: Vancomycin Tr: 61.9 ug/mL (ref 10.0–20.0)

## 2011-04-06 LAB — WOUND CULTURE: Gram Stain: NONE SEEN

## 2011-04-06 MED ORDER — OXYCODONE-ACETAMINOPHEN 5-325 MG PO TABS
1.0000 | ORAL_TABLET | ORAL | Status: DC | PRN
  Administered 2011-04-07 – 2011-04-12 (×6): 2 via ORAL
  Filled 2011-04-06 (×6): qty 2

## 2011-04-06 MED ORDER — FAMOTIDINE 20 MG PO TABS
20.0000 mg | ORAL_TABLET | Freq: Two times a day (BID) | ORAL | Status: DC
  Administered 2011-04-06 – 2011-04-12 (×12): 20 mg via ORAL
  Filled 2011-04-06 (×13): qty 1

## 2011-04-06 MED ORDER — MORPHINE SULFATE 2 MG/ML IJ SOLN
2.0000 mg | INTRAMUSCULAR | Status: DC | PRN
  Administered 2011-04-06 – 2011-04-11 (×3): 2 mg via INTRAVENOUS
  Filled 2011-04-06 (×3): qty 1

## 2011-04-06 NOTE — Progress Notes (Signed)
Subjective: This is a 61 y/o female with diabetes who was admitted for DKA and an infected left foot ulcer with gangrenous changes of the the 5th toeShe has undergone a Left fem-pop bypass and transmetatarsal amputation.  Constipated.  Off Morphine PCA now. No other complaints.   Objective: Blood pressure 143/66, pulse 73, temperature 98.4 F (36.9 C), temperature source Oral, resp. rate 13, height 5\' 6"  (1.676 m), weight 114.7 kg (252 lb 13.9 oz), SpO2 98.00%. Weight change:   Intake/Output Summary (Last 24 hours) at 04/06/11 1406 Last data filed at 04/06/11 1157  Gross per 24 hour  Intake    470 ml  Output   1545 ml  Net  -1075 ml    Physical Exam: General appearance: alert, cooperative and no distress Head: Normocephalic, without obvious abnormality, atraumatic Eyes: conjunctivae/corneas clear. PERRL, EOM's intact.  Throat: lips, mucosa, and tongue normal Lungs: clear to auscultation bilaterally Heart: regular rate and rhythm, S1, S2 normal, no murmur, click, rub or gallop Abdomen: soft, non-tender; bowel sounds normal; no masses,  no organomegaly Extremities: extremities normal, atraumatic, no cyanosis ; edema of right hand improving Skin: left foot heavily bandaged.  Neurologic: Grossly normal  Lab Results:  Basename 04/05/11 1623 04/05/11 0330  NA 133* 132*  K 5.7* 5.5*  CL 100 102  CO2 26 24  GLUCOSE 135* 155*  BUN 18 18  CREATININE 1.40* 1.40*  CALCIUM 9.0 8.6  MG -- --  PHOS -- --   No results found for this basename: AST:2,ALT:2,ALKPHOS:2,BILITOT:2,PROT:2,ALBUMIN:2 in the last 72 hours No results found for this basename: LIPASE:2,AMYLASE:2 in the last 72 hours  Basename 04/05/11 0330  WBC 10.3  NEUTROABS --  HGB 7.6*  HCT 24.0*  MCV 95.6  PLT 428*   No results found for this basename: CKTOTAL:3,CKMB:3,CKMBINDEX:3,TROPONINI:3 in the last 72 hours No components found with this basename: POCBNP:3 No results found for this basename: DDIMER:2 in the last  72 hours No results found for this basename: HGBA1C:2 in the last 72 hours No results found for this basename: CHOL:2,HDL:2,LDLCALC:2,TRIG:2,CHOLHDL:2,LDLDIRECT:2 in the last 72 hours No results found for this basename: TSH,T4TOTAL,FREET3,T3FREE,THYROIDAB in the last 72 hours No results found for this basename: VITAMINB12:2,FOLATE:2,FERRITIN:2,TIBC:2,IRON:2,RETICCTPCT:2 in the last 72 hours  Micro Results: Recent Results (from the past 240 hour(s))  MRSA PCR SCREENING     Status: Normal   Collection Time   03/30/11  3:07 AM      Component Value Range Status Comment   MRSA by PCR NEGATIVE  NEGATIVE  Final   WOUND CULTURE     Status: Normal   Collection Time   04/04/11  1:58 PM      Component Value Range Status Comment   Specimen Description WOUND FOOT LEFT   Final    Special Requests SWAB FROM LEFT FIFTH TOE   Final    Gram Stain     Final    Value: NO WBC SEEN     MODERATE SQUAMOUS EPITHELIAL CELLS PRESENT     RARE GRAM POSITIVE RODS   Culture     Final    Value: FEW GROUP B STREP(S.AGALACTIAE)ISOLATED     Note: TESTING AGAINST S. AGALACTIAE NOT ROUTINELY PERFORMED DUE TO PREDICTABILITY OF AMP/PEN/VAN SUSCEPTIBILITY.   Report Status 04/06/2011 FINAL   Final   ANAEROBIC CULTURE     Status: Normal (Preliminary result)   Collection Time   04/04/11  1:58 PM      Component Value Range Status Comment   Specimen Description WOUND FOOT  LEFT   Final    Special Requests SWAB FROM LEFT FIFTH TOE   Final    Gram Stain     Final    Value: NO WBC SEEN     MODERATE SQUAMOUS EPITHELIAL CELLS PRESENT     RARE GRAM POSITIVE RODS   Culture     Final    Value: NO ANAEROBES ISOLATED; CULTURE IN PROGRESS FOR 5 DAYS   Report Status PENDING   Incomplete     Studies/Results: Dg Foot Complete Left  03/29/2011  *RADIOLOGY REPORT*  Clinical Data: Diabetic ulcer the base of the fifth toe.  LEFT FOOT - COMPLETE 3+ VIEW  Comparison: None.  Findings: Soft tissue swelling and soft tissue defect in the  lateral aspect of the forefoot over the ankle metatarsal phalangeal region.  There is a suggestion of small gas bubbles in the subcutaneous tissues which might represent soft tissue abscess or infection.  The underlying bones appear intact without evidence of cortical erosion or sclerosis.  No obvious changes of osteomyelitis.  There is thickening of the bone cortex along the shaft of the fourth metatarsal bone which probably represents old healed fracture or stress reaction.  Mild degenerative changes in the intertarsal and tarsometatarsal joints.  Accessory navicular ossicle.  Small plantar calcaneal spur.  Vascular calcifications.  IMPRESSION: Soft tissue defect, swelling, and soft tissue gas in the lateral aspect of the left forefoot consistent with soft tissue infection. No specific evidence of osteomyelitis.  Cortical thickening along the shaft of the fourth metatarsal bone probably representing old fracture deformity or stress reaction.  Degenerative changes in the tarsal joints.  Original Report Authenticated By: Marlon Pel, M.D.    Medications: Scheduled Meds:    . allopurinol  300 mg Oral Daily  . amLODipine  10 mg Oral Daily  . aspirin EC  81 mg Oral Daily  . docusate sodium  100 mg Oral BID  . famotidine  20 mg Oral BID  . gabapentin  800 mg Oral TID  . imipenem-cilastatin  500 mg Intravenous Q8H  . insulin aspart  0-15 Units Subcutaneous TID WC  . insulin aspart  0-5 Units Subcutaneous QHS  . insulin glargine  60 Units Subcutaneous BID  . metoprolol tartrate  12.5 mg Oral BID  . vancomycin  1,000 mg Intravenous Q12H  . DISCONTD: famotidine (PEPCID) IV  20 mg Intravenous Q12H  . DISCONTD: insulin aspart  0-15 Units Subcutaneous TID WC  . DISCONTD: morphine   Intravenous Q4H   Continuous Infusions:   PRN Meds:.acetaminophen, acetaminophen, alum & mag hydroxide-simeth, cyclobenzaprine, guaiFENesin-dextromethorphan, morphine injection, ondansetron, oxyCODONE-acetaminophen,  phenol, zolpidem, DISCONTD: diphenhydrAMINE, DISCONTD: diphenhydrAMINE, DISCONTD: naloxone, DISCONTD: ondansetron (ZOFRAN) IV, DISCONTD: sodium chloride  Assessment/Plan:  Diabetic foot ulcer/ leukocytosis/ fevers - Was initially being managed with Zosyn and VAncomycin. Due to continued fevers, I have changed her Zosyn to Imipenem (1/3). Fevers have resolved for the time being and leukocytosis continues to improve. S/p left fem-pop and and transmetatarsal amputation on 1/3 performed by Dr Edilia Bo.    Diabetes mellitus- sugars noted to be extremely uncontrolled on admission although the patient states usually they are quite well controlled.  They have steadily improved and today are ranging below 150.  I have switched her high dose sliding scale to moderate. Sugars remain stable.    Hyponatremia- likely from dehydration secondary to uncontrolled sugars. The HCTZ that she takes is probably contributing as well.  It improved with hydration. I have continued to hold HCTZ.  Hyperkalemia-  Not sure why this continues to increase. Her renal function is unchanged.  We are holding her ARB and not providing any supplemental K+. Will follow.    HTN (hypertension)- resumed Norvasc.  Hold HCTZ for now. Cont to hold Cozaar due to hyperkalemia. Metoprolol added.    Jerking right arm- Improved with Flexeril which is what she takes at home when this occurs.  Edema right hand- improving with elevation - likely from IVF>   GERD (gastroesophageal reflux disease)  Gout  CKD (chronic kidney disease) stage 3, GFR 30-59 ml/min- no baseline creatinine to compare this with.  Grade 2 diastolic dysfunction- noted on ECHO on 12/30. On CCB.  Obesity, morbid Constipation- dulcolax   LOS: 8 days   [facility] [phone] 04/06/2011, 2:06 PM

## 2011-04-06 NOTE — Progress Notes (Signed)
Physical Therapy Wound Treatment Patient Details  Name: Pamela [name] MRN: [id number] Date of Birth: [birth year]  Today's Date: 04/06/2011 Time:  -     Subjective  Subjective: "I don't remember this from yesterday" Patient and Family Stated Goals: To get back to walking and keep leg.  Pain Score: Pain Score:   6  Wound Assessment     Wound 04/05/11 Amputation Foot Left open incision of transmetatarsal amputation (Active)  Site / Wound Assessment Red;Yellow 04/06/2011  2:06 PM  % Wound base Red or Granulating 0% 04/06/2011  2:06 PM  % Wound base Yellow 0% 04/06/2011  2:06 PM  Peri-wound Assessment Intact 04/06/2011  2:06 PM  Wound Length (cm) 4 cm 04/05/2011  3:30 PM  Wound Width (cm) 12 cm 04/05/2011  3:30 PM  Wound Depth (cm) 1 cm 04/05/2011  3:30 PM  Drainage Amount Minimal 04/06/2011  2:06 PM  Drainage Description Serosanguineous 04/06/2011  2:06 PM  Treatment Hydrotherapy (Pulse lavage) 04/06/2011  2:06 PM  Dressing Type Gauze (Comment);Moist to dry 04/06/2011  2:06 PM  Dressing Changed Changed 04/05/2011  8:00 PM   Hydrotherapy Pulsed lavage therapy - wound location: lt. transmetatarsal incision Pulsed Lavage with Suction (psi): 4 psi (4-8 psi) Pulsed Lavage with Suction - Normal Saline Used: 1000 mL Pulsed Lavage Tip: Tip with splash shield   Wound Assessment and Plan  Wound Therapy - Assess/Plan/Recommendations Wound Therapy - Clinical Statement: tolerated session well today with out complaints of increased foot pain, did report increased upper thigh/leg pain with repositioning. Hydrotherapy Plan: Debridement;Dressing change;Patient/family education;Pulsatile lavage with suction Wound Therapy - Frequency: 6X / week  Wound Therapy Goals- Improve the function of patient's integumentary system by progressing the wound(s) through the phases of wound healing (inflammation - proliferation - remodeling) by: Decrease Necrotic Tissue - Progress: Progressing toward goal Increase Granulation Tissue -  Progress: Progressing toward goal  Goals will be updated until maximal potential achieved or discharge criteria met.  Discharge criteria: when goals achieved, discharge from hospital, MD decision/surgical intervention, no progress towards goals, refusal/missing three consecutive treatments without notification or medical reason.  Sallyanne Kuster 04/06/2011, 2:13 PM  Sallyanne Kuster, PTA Office- [phone]

## 2011-04-06 NOTE — Progress Notes (Signed)
Subjective: Interval History: none..   Objective: Vital signs in last 24 hours: Temp:  [98.1 F (36.7 C)-98.9 F (37.2 C)] 98.7 F (37.1 C) (01/05 0800) Pulse Rate:  [70-81] 77  (01/05 0800) Resp:  [12-23] 15  (01/05 0419) BP: (104-139)/(49-64) 128/52 mmHg (01/05 0800) SpO2:  [90 %-100 %] 98 % (01/05 0800) Weight:  [252 lb 13.9 oz (114.7 kg)] 252 lb 13.9 oz (114.7 kg) (01/05 0600)  Intake/Output from previous day: 01/04 0701 - 01/05 0700 In: 905 [P.O.:480; I.V.:75; IV Piggyback:350] Out: 855 [Urine:825; Drains:30] Intake/Output this shift: Total I/O In: -  Out: 910 [Urine:875; Drains:35]  General appearance: alert, cooperative and no distress Extremities: 2+ at pulse.  Dressing intact  Lab Results:  [facility] 04/05/11 0330  WBC 10.3  HGB 7.6*  HCT 24.0*  PLT 428*   BMET  Basename 04/05/11 1623 04/05/11 0330  NA 133* 132*  K 5.7* 5.5*  CL 100 102  CO2 26 24  GLUCOSE 135* 155*  BUN 18 18  CREATININE 1.40* 1.40*  CALCIUM 9.0 8.6    Studies/Results: Dg Ang/ext/uni/or Left  04/04/2011  *RADIOLOGY REPORT*  Clinical Data: Left lower extremity bypass grafting.  LEFT ANG/EXT/UNI/ OR  Comparison: None.  Findings: Intraoperative image shows distal end of a bypass graft with anastomosis to the popliteal artery above the knee.  There is some irregularity at the level of the distal anastomosis. Visualized runoff in the proximal to mid calf shows normally opacified tibial vessels.  IMPRESSION: Bypass graft to the left popliteal artery.  Anastomosis to the popliteal artery above the knee is mildly irregular.  Original Report Authenticated By: Reola Calkins, M.D.   Dg Foot Complete Left  03/29/2011  *RADIOLOGY REPORT*  Clinical Data: Diabetic ulcer the base of the fifth toe.  LEFT FOOT - COMPLETE 3+ VIEW  Comparison: None.  Findings: Soft tissue swelling and soft tissue defect in the lateral aspect of the forefoot over the ankle metatarsal phalangeal region.  There is a  suggestion of small gas bubbles in the subcutaneous tissues which might represent soft tissue abscess or infection.  The underlying bones appear intact without evidence of cortical erosion or sclerosis.  No obvious changes of osteomyelitis.  There is thickening of the bone cortex along the shaft of the fourth metatarsal bone which probably represents old healed fracture or stress reaction.  Mild degenerative changes in the intertarsal and tarsometatarsal joints.  Accessory navicular ossicle.  Small plantar calcaneal spur.  Vascular calcifications.  IMPRESSION: Soft tissue defect, swelling, and soft tissue gas in the lateral aspect of the left forefoot consistent with soft tissue infection. No specific evidence of osteomyelitis.  Cortical thickening along the shaft of the fourth metatarsal bone probably representing old fracture deformity or stress reaction.  Degenerative changes in the tarsal joints.  Original Report Authenticated By: Marlon Pel, M.D.   Anti-infectives: Anti-infectives     Start     Dose/Rate Route Frequency Ordered Stop   04/04/11 2200   cefUROXime (ZINACEF) 1.5 g in dextrose 5 % 50 mL IVPB  Status:  Discontinued        1.5 g 100 mL/hr over 30 Minutes Intravenous Every 12 hours 04/04/11 1658 04/04/11 1750   04/02/11 1500   vancomycin (VANCOCIN) IVPB 1000 mg/200 mL premix        1,000 mg 200 mL/hr over 60 Minutes Intravenous Every 12 hours 04/02/11 1335     04/01/11 1800   imipenem-cilastatin (PRIMAXIN) 500 mg in sodium chloride 0.9 % 100  mL IVPB        500 mg 200 mL/hr over 30 Minutes Intravenous 3 times per day 04/01/11 1610     04/01/11 1000   sulfamethoxazole-trimethoprim (BACTRIM DS) 800-160 MG per tablet 1 tablet  Status:  Discontinued        1 tablet Oral 2 times daily 04/01/11 0912 04/01/11 0942   03/30/11 2100   vancomycin (VANCOCIN) 1,500 mg in sodium chloride 0.9 % 500 mL IVPB  Status:  Discontinued        1,500 mg 250 mL/hr over 120 Minutes Intravenous  Every 24 hours 03/30/11 0821 04/02/11 1335   03/30/11 0600   vancomycin (VANCOCIN) IVPB 1000 mg/200 mL premix  Status:  Discontinued        1,000 mg 200 mL/hr over 60 Minutes Intravenous Every 12 hours 03/30/11 0224 03/30/11 0818   03/30/11 0600   piperacillin-tazobactam (ZOSYN) IVPB 3.375 g  Status:  Discontinued        3.375 g 12.5 mL/hr over 240 Minutes Intravenous 3 times per day 03/30/11 0224 04/01/11 1610   03/29/11 2300   vancomycin (VANCOCIN) IVPB 1000 mg/200 mL premix        1,000 mg 200 mL/hr over 60 Minutes Intravenous  Once 03/29/11 2246 03/30/11 0023   03/29/11 2300   piperacillin-tazobactam (ZOSYN) IVPB 3.375 g        3.375 g 12.5 mL/hr over 240 Minutes Intravenous  Once 03/29/11 2246 03/30/11 0022          Assessment/Plan: s/p Procedure(s): BYPASS GRAFT FEMORAL-POPLITEAL ARTERY AMPUTATION DIGIT Comfortable. Cont LWC and mobility.  Transfer to 2000. DC foley   LOS: 8 days   Samarie Pinder F 04/06/2011, 9:15 AM

## 2011-04-06 NOTE — Progress Notes (Signed)
Lab called with critical value. Vancomycin trough level was 61.9. pharmarcy notified.

## 2011-04-06 NOTE — Progress Notes (Signed)
Physical Therapy Evaluation Patient Details Name: Pamela [name] MRN: [id number] DOB: [birth year] Today's Date: 04/06/2011  Problem List:  Patient Active Problem List  Diagnoses  . Diabetes mellitus  . Diabetic foot ulcer  . Obesities, morbid  . Hyponatremia  . HTN (hypertension)  . GERD (gastroesophageal reflux disease)  . Gout  . CKD (chronic kidney disease) stage 3, GFR 30-59 ml/min  . PAD (peripheral artery disease)  . Preop cardiovascular exam    Past Medical History:  Past Medical History  Diagnosis Date  . Diabetes mellitus   . GERD (gastroesophageal reflux disease)   . Gout   . Hypertension   . High cholesterol   . Arthritis    Past Surgical History:  Past Surgical History  Procedure Date  . Cesarean section   . Femoral-popliteal bypass graft 04/04/2011    Procedure: BYPASS GRAFT FEMORAL-POPLITEAL ARTERY;  Surgeon: Chuck Hint, MD;  Location: [facility] OR;  Service: Vascular;  Laterality: Left;  left femoral-above knee popliteal bypass graft using left  non reverse greater saphenous vein  . Amputation 04/04/2011    Procedure: AMPUTATION DIGIT;  Surgeon: Chuck Hint, MD;  Location: [facility] OR;  Service: Vascular;  Laterality: Left;   left forefoot amputation    PT Assessment/Plan/Recommendation PT Assessment Clinical Impression Statement: Patient is a 61 yo female admitted with foot ulcer LLE, s/p bypass graft and amputation toes LLE.  Patient reports she has tremors at home, but they are much worse now.  Jerking movements noted all extremities, impacting functional mobility (ability to hold handles of RW, ability to remain in standing, etc.).  Patient was independent with mobility pta.  Recommend [facility] for continued therapy at discharge with goal to achieve supervision level. PT Recommendation/Assessment: Patient will need skilled PT in the acute care venue PT Problem List: Decreased strength;Decreased activity tolerance;Decreased balance;Decreased  mobility;Decreased coordination;Decreased knowledge of use of DME;Pain Barriers to Discharge: Decreased caregiver support PT Therapy Diagnosis : Difficulty walking;Generalized weakness;Acute pain PT Plan PT Frequency: Min 3X/week PT Treatment/Interventions: DME instruction;Gait training;Functional mobility training;Therapeutic activities;Balance training;Patient/family education PT Recommendation Follow Up Recommendations: Skilled nursing facility Equipment Recommended: Defer to next venue PT Goals  Acute Rehab PT Goals PT Goal Formulation: With patient Time For Goal Achievement: 2 weeks Pt will Roll Supine to Right Side: with modified independence;with rail PT Goal: Rolling Supine to Right Side - Progress: Not met Pt will Roll Supine to Left Side: with modified independence;with rail PT Goal: Rolling Supine to Left Side - Progress: Not met Pt will go Supine/Side to Sit: with modified independence;with HOB 0 degrees;with rail PT Goal: Supine/Side to Sit - Progress: Not met Pt will go Sit to Supine/Side: with modified independence;with HOB 0 degrees;with rail PT Goal: Sit to Supine/Side - Progress: Not met Pt will go Sit to Stand: with min assist PT Goal: Sit to Stand - Progress: Not met Pt will go Stand to Sit: with min assist PT Goal: Stand to Sit - Progress: Not met Pt will Transfer Bed to Chair/Chair to Bed: with min assist PT Transfer Goal: Bed to Chair/Chair to Bed - Progress: Not met Pt will Ambulate: 16 - 50 feet;with min assist;with rolling walker PT Goal: Ambulate - Progress: Not met  PT Evaluation Precautions/Restrictions  Precautions Precautions: Fall Precaution Comments: Patient with jerking movements of extremities impacting mobility and safety.  Bil knees buckling Restrictions Weight Bearing Restrictions: Yes LLE Weight Bearing: Partial weight bearing Other Position/Activity Restrictions: Darko shoe LLE; weight bearing on heel of left foot only Prior  Functioning    Home Living Lives With: Son;Other (Comment) (Mother) Type of Home: House Home Layout: One level Home Access: Ramped entrance Bathroom Shower/Tub:  (Sits on toilet for bath) Bathroom Toilet: Standard Home Adaptive Equipment: Bedside commode/3-in-1;Walker - rolling Prior Function Level of Independence: Independent with basic ADLs;Independent with gait;Needs assistance with homemaking Driving: Yes Cognition Cognition Arousal/Alertness: Awake/alert Overall Cognitive Status: Appears within functional limits for tasks assessed Orientation Level: Oriented X4 Cognition - Other Comments: Easily agitated (with lines).  Frustrated with tremors/jerking movements Sensation/Coordination Coordination Gross Motor Movements are Fluid and Coordinated: No Coordination and Movement Description: Note intermittent jerking movements of all extremities Extremity Assessment RUE Assessment RUE Assessment: Within Functional Limits LUE Assessment LUE Assessment: Within Functional Limits RLE Assessment RLE Assessment: Within Functional Limits (Grossly 4/5) LLE Assessment LLE Assessment: Exceptions to [facility] (Grossly 3+/5 overall with pain limiting movement) Mobility (including Balance) Bed Mobility Bed Mobility: Yes Sit to Supine - Right: 4: Min assist;HOB flat;With rail Sit to Supine - Right Details (indicate cue type and reason): Cues for technique.  Assist to raise LE's on to bed. Transfers Transfers: Yes Sit to Stand: 3: Mod assist;With upper extremity assist;With armrests;From chair/3-in-1 Sit to Stand Details (indicate cue type and reason): Repeated cues for safe hand placement.  Repeated sit to stand from chair x3.  Patient's knees buckled, and patient sat back on chair x2.  Mod assist to shift weight forward over feet. Stand to Sit: 3: Mod assist;With upper extremity assist;To bed Stand to Sit Details: Cues for safe hand placement.  Assist to slowly lower to surface safely. Stand Pivot Transfers:  3: Mod assist Stand Pivot Transfer Details (indicate cue type and reason): Able to take short steps to turn toward bed from chair.  Steps with knees extended to prevent buckling.  Decreased knee control Ambulation/Gait Ambulation/Gait: No  Balance Balance Assessed: No Exercise    End of Session PT - End of Session Equipment Utilized During Treatment: Gait belt (Darko shoe LLE) Activity Tolerance: Patient limited by pain;Patient limited by fatigue Patient left: in bed;with call bell in reach Nurse Communication: Mobility status for transfers;Weight bearing status General Behavior During Session: [facility] for tasks performed Cognition: [facility] for tasks performed  Vena Austria 04/06/2011, 4:40 PM

## 2011-04-06 NOTE — Progress Notes (Signed)
Pharmacy--Vancomycin/Primaxin  OBJECTIVE:  Vanc level (1545):  61.9 mcg/mL SCr: 1.4, CrCl~50 ml/min.  ASSESSMENT:  61 y.o. F on Vancomycin/Primaxin for diabetic foot ulcer. Vancomycin dose was hung today at 1432 and "Vanc trough" was drawn after this time at 1545 and resulted as 61.9 mcg/mL. This level is not reflective of a "trough" with a goal of 15-20 as Vancomycin had just been hung and is more representative of a peak level.  PLAN:  1) Will d/c Vanc doses for now 2) Will recheck true Vanc trough ~12 hours after dose hung this afternoon and make dose adjustments at that time 3) Continue Primaxin 500 mg IV every 8 hours  Georgina Pillion, PharmD, BCPS 04/06/2011 5:05 PM

## 2011-04-06 NOTE — Progress Notes (Signed)
Dr. Butler Denmark ok with transfer to 2000.  Pt to transfer to 2025.  Report called to receiving RN (Ray).  Pt transferred via bed with O2 and belongings.    Roselie Awkward, RN

## 2011-04-07 DIAGNOSIS — I739 Peripheral vascular disease, unspecified: Secondary | ICD-10-CM

## 2011-04-07 LAB — VANCOMYCIN, RANDOM
Vancomycin Rm: 40.2 ug/mL
Vancomycin Rm: 31.3 ug/mL

## 2011-04-07 LAB — GLUCOSE, CAPILLARY
Glucose-Capillary: 79 mg/dL (ref 70–99)
Glucose-Capillary: 112 mg/dL — ABNORMAL HIGH (ref 70–99)
Glucose-Capillary: 97 mg/dL (ref 70–99)

## 2011-04-07 LAB — BASIC METABOLIC PANEL
Calcium: 9.3 mg/dL (ref 8.4–10.5)
Creatinine, Ser: 1.5 mg/dL — ABNORMAL HIGH (ref 0.50–1.10)
GFR calc Af Amer: 42 mL/min — ABNORMAL LOW (ref >90)
GFR calc non Af Amer: 36 mL/min — ABNORMAL LOW (ref >90)
Sodium: 136 mEq/L (ref 135–145)

## 2011-04-07 MED ORDER — METOPROLOL TARTRATE 25 MG PO TABS
25.0000 mg | ORAL_TABLET | Freq: Two times a day (BID) | ORAL | Status: DC
  Administered 2011-04-07 – 2011-04-12 (×10): 25 mg via ORAL
  Filled 2011-04-07 (×11): qty 1

## 2011-04-07 MED ORDER — SODIUM CHLORIDE 0.9 % IV SOLN
INTRAVENOUS | Status: DC
  Administered 2011-04-07 – 2011-04-08 (×2): via INTRAVENOUS
  Administered 2011-04-09: 75 mL/h via INTRAVENOUS
  Administered 2011-04-10: 06:00:00 via INTRAVENOUS

## 2011-04-07 MED ORDER — VANCOMYCIN HCL IN DEXTROSE 1-5 GM/200ML-% IV SOLN
1000.0000 mg | INTRAVENOUS | Status: DC
  Filled 2011-04-07: qty 200

## 2011-04-07 NOTE — Progress Notes (Signed)
Pharmacy--Vancomycin  OBJECTIVE:  Vanc level: 40.2 mcg/mL SCr: 1.4, CrCl~50 ml/min (last SCr 48h old)  ASSESSMENT:  60yo supratherapeutic on vancomycin for diabetic foot ulcer; level drawn yesterday just after dose = 61, actual trough of 40 unexpected for size/renal function.  PLAN:  Will obtain additional vanc level in 10h to calculate kinetics; will also obtain BMET for updated SCr to see whether CrCl is worsening.  Vernard Gambles, PharmD, BCPS  04/07/2011 3:47 AM

## 2011-04-07 NOTE — Progress Notes (Signed)
Subjective: Interval History: none.. Reports shaking feeling. Objective: Vital signs in last 24 hours: Temp:  [98.2 F (36.8 C)-100.1 F (37.8 C)] 98.3 F (36.8 C) (01/06 0448) Pulse Rate:  [72-84] 72  (01/06 0448) Resp:  [13-18] 18  (01/06 0448) BP: (124-149)/(65-74) 143/74 mmHg (01/06 0448) SpO2:  [91 %-98 %] 93 % (01/06 0448)  Intake/Output from previous day: 01/05 0701 - 01/06 0700 In: -  Out: 2735 [Urine:2700; Drains:35] Intake/Output this shift: Total I/O In: 240 [P.O.:240] Out: -   General appearance: alert, cooperative and no distress Extremities: 2+AT pulse. TMA healing with good granulation tissue  Lab Results:  Basename 04/05/11 0330  WBC 10.3  HGB 7.6*  HCT 24.0*  PLT 428*   BMET  Basename 04/05/11 1623 04/05/11 0330  NA 133* 132*  K 5.7* 5.5*  CL 100 102  CO2 26 24  GLUCOSE 135* 155*  BUN 18 18  CREATININE 1.40* 1.40*  CALCIUM 9.0 8.6    Studies/Results: Dg Ang/ext/uni/or Left  04/04/2011  *RADIOLOGY REPORT*  Clinical Data: Left lower extremity bypass grafting.  LEFT ANG/EXT/UNI/ OR  Comparison: None.  Findings: Intraoperative image shows distal end of a bypass graft with anastomosis to the popliteal artery above the knee.  There is some irregularity at the level of the distal anastomosis. Visualized runoff in the proximal to mid calf shows normally opacified tibial vessels.  IMPRESSION: Bypass graft to the left popliteal artery.  Anastomosis to the popliteal artery above the knee is mildly irregular.  Original Report Authenticated By: Reola Calkins, M.D.   Dg Foot Complete Left  03/29/2011  *RADIOLOGY REPORT*  Clinical Data: Diabetic ulcer the base of the fifth toe.  LEFT FOOT - COMPLETE 3+ VIEW  Comparison: None.  Findings: Soft tissue swelling and soft tissue defect in the lateral aspect of the forefoot over the ankle metatarsal phalangeal region.  There is a suggestion of small gas bubbles in the subcutaneous tissues which might represent soft  tissue abscess or infection.  The underlying bones appear intact without evidence of cortical erosion or sclerosis.  No obvious changes of osteomyelitis.  There is thickening of the bone cortex along the shaft of the fourth metatarsal bone which probably represents old healed fracture or stress reaction.  Mild degenerative changes in the intertarsal and tarsometatarsal joints.  Accessory navicular ossicle.  Small plantar calcaneal spur.  Vascular calcifications.  IMPRESSION: Soft tissue defect, swelling, and soft tissue gas in the lateral aspect of the left forefoot consistent with soft tissue infection. No specific evidence of osteomyelitis.  Cortical thickening along the shaft of the fourth metatarsal bone probably representing old fracture deformity or stress reaction.  Degenerative changes in the tarsal joints.  Original Report Authenticated By: Marlon Pel, M.D.   Anti-infectives: Anti-infectives     Start     Dose/Rate Route Frequency Ordered Stop   04/04/11 2200   cefUROXime (ZINACEF) 1.5 g in dextrose 5 % 50 mL IVPB  Status:  Discontinued        1.5 g 100 mL/hr over 30 Minutes Intravenous Every 12 hours 04/04/11 1658 04/04/11 1750   04/02/11 1500   vancomycin (VANCOCIN) IVPB 1000 mg/200 mL premix  Status:  Discontinued        1,000 mg 200 mL/hr over 60 Minutes Intravenous Every 12 hours 04/02/11 1335 04/06/11 1657   04/01/11 1800   imipenem-cilastatin (PRIMAXIN) 500 mg in sodium chloride 0.9 % 100 mL IVPB        500 mg 200 mL/hr  over 30 Minutes Intravenous 3 times per day 04/01/11 1610     04/01/11 1000   sulfamethoxazole-trimethoprim (BACTRIM DS) 800-160 MG per tablet 1 tablet  Status:  Discontinued        1 tablet Oral 2 times daily 04/01/11 0912 04/01/11 0942   03/30/11 2100   vancomycin (VANCOCIN) 1,500 mg in sodium chloride 0.9 % 500 mL IVPB  Status:  Discontinued        1,500 mg 250 mL/hr over 120 Minutes Intravenous Every 24 hours 03/30/11 0821 04/02/11 1335   03/30/11  0600   vancomycin (VANCOCIN) IVPB 1000 mg/200 mL premix  Status:  Discontinued        1,000 mg 200 mL/hr over 60 Minutes Intravenous Every 12 hours 03/30/11 0224 03/30/11 0818   03/30/11 0600   piperacillin-tazobactam (ZOSYN) IVPB 3.375 g  Status:  Discontinued        3.375 g 12.5 mL/hr over 240 Minutes Intravenous 3 times per day 03/30/11 0224 04/01/11 1610   03/29/11 2300   vancomycin (VANCOCIN) IVPB 1000 mg/200 mL premix        1,000 mg 200 mL/hr over 60 Minutes Intravenous  Once 03/29/11 2246 03/30/11 0023   03/29/11 2300   piperacillin-tazobactam (ZOSYN) IVPB 3.375 g        3.375 g 12.5 mL/hr over 240 Minutes Intravenous  Once 03/29/11 2246 03/30/11 0022          Assessment/Plan: s/p Procedure(s): BYPASS GRAFT FEMORAL-POPLITEAL ARTERY AMPUTATION DIGIT Stable post op.  Cont LWC to foot. Graft patent   LOS: 9 days   Rolin Schult F 04/07/2011, 11:55 AM

## 2011-04-07 NOTE — Progress Notes (Signed)
Subjective: No complaints.   Objective: Blood pressure 145/72, pulse 73, temperature 98.4 F (36.9 C), temperature source Oral, resp. rate 16, height 5\' 6"  (1.676 m), weight 114.7 kg (252 lb 13.9 oz), SpO2 93.00%. Weight change:   Intake/Output Summary (Last 24 hours) at 04/07/11 1542 Last data filed at 04/07/11 0800  Gross per 24 hour  Intake    240 ml  Output   1550 ml  Net  -1310 ml    Physical Exam: General appearance: alert, cooperative and no distress  Head: Normocephalic, without obvious abnormality, atraumatic  Eyes: conjunctivae/corneas clear. PERRL, EOM's intact.  Throat: lips, mucosa, and tongue normal  Lungs: clear to auscultation bilaterally  Heart: regular rate and rhythm, S1, S2 normal, no murmur, click, rub or gallop  Abdomen: soft, non-tender; bowel sounds normal; no masses, no organomegaly  Extremities: extremities normal, atraumatic, no cyanosis ; edema of right hand improving  Skin: left foot heavily bandaged.  Neurologic: Grossly normal   Lab Results:  Basename 04/07/11 1127 04/05/11 1623  NA 136 133*  K 5.5* 5.7*  CL 101 100  CO2 27 26  GLUCOSE 105* 135*  BUN 20 18  CREATININE 1.50* 1.40*  CALCIUM 9.3 9.0  MG -- --  PHOS -- --   No results found for this basename: AST:2,ALT:2,ALKPHOS:2,BILITOT:2,PROT:2,ALBUMIN:2 in the last 72 hours No results found for this basename: LIPASE:2,AMYLASE:2 in the last 72 hours  Basename 04/05/11 0330  WBC 10.3  NEUTROABS --  HGB 7.6*  HCT 24.0*  MCV 95.6  PLT 428*   No results found for this basename: CKTOTAL:3,CKMB:3,CKMBINDEX:3,TROPONINI:3 in the last 72 hours No components found with this basename: POCBNP:3 No results found for this basename: DDIMER:2 in the last 72 hours No results found for this basename: HGBA1C:2 in the last 72 hours No results found for this basename: CHOL:2,HDL:2,LDLCALC:2,TRIG:2,CHOLHDL:2,LDLDIRECT:2 in the last 72 hours No results found for this basename:  TSH,T4TOTAL,FREET3,T3FREE,THYROIDAB in the last 72 hours No results found for this basename: VITAMINB12:2,FOLATE:2,FERRITIN:2,TIBC:2,IRON:2,RETICCTPCT:2 in the last 72 hours  Micro Results: Recent Results (from the past 240 hour(s))  MRSA PCR SCREENING     Status: Normal   Collection Time   03/30/11  3:07 AM      Component Value Range Status Comment   MRSA by PCR NEGATIVE  NEGATIVE  Final   WOUND CULTURE     Status: Normal   Collection Time   04/04/11  1:58 PM      Component Value Range Status Comment   Specimen Description WOUND FOOT LEFT   Final    Special Requests SWAB FROM LEFT FIFTH TOE   Final    Gram Stain     Final    Value: NO WBC SEEN     MODERATE SQUAMOUS EPITHELIAL CELLS PRESENT     RARE GRAM POSITIVE RODS   Culture     Final    Value: FEW GROUP B STREP(S.AGALACTIAE)ISOLATED     Note: TESTING AGAINST S. AGALACTIAE NOT ROUTINELY PERFORMED DUE TO PREDICTABILITY OF AMP/PEN/VAN SUSCEPTIBILITY.   Report Status 04/06/2011 FINAL   Final   ANAEROBIC CULTURE     Status: Normal (Preliminary result)   Collection Time   04/04/11  1:58 PM      Component Value Range Status Comment   Specimen Description WOUND FOOT LEFT   Final    Special Requests SWAB FROM LEFT FIFTH TOE   Final    Gram Stain     Final    Value: NO WBC SEEN  MODERATE SQUAMOUS EPITHELIAL CELLS PRESENT     RARE GRAM POSITIVE RODS   Culture     Final    Value: NO ANAEROBES ISOLATED; CULTURE IN PROGRESS FOR 5 DAYS   Report Status PENDING   Incomplete     Studies/Results: Dg Ang/ext/uni/or Left  04/04/2011  *RADIOLOGY REPORT*  Clinical Data: Left lower extremity bypass grafting.  LEFT ANG/EXT/UNI/ OR  Comparison: None.  Findings: Intraoperative image shows distal end of a bypass graft with anastomosis to the popliteal artery above the knee.  There is some irregularity at the level of the distal anastomosis. Visualized runoff in the proximal to mid calf shows normally opacified tibial vessels.  IMPRESSION: Bypass graft  to the left popliteal artery.  Anastomosis to the popliteal artery above the knee is mildly irregular.  Original Report Authenticated By: Reola Calkins, M.D.   Medications: Scheduled Meds:   . allopurinol  300 mg Oral Daily  . amLODipine  10 mg Oral Daily  . aspirin EC  81 mg Oral Daily  . docusate sodium  100 mg Oral BID  . famotidine  20 mg Oral BID  . gabapentin  800 mg Oral TID  . imipenem-cilastatin  500 mg Intravenous Q8H  . insulin aspart  0-15 Units Subcutaneous TID WC  . insulin aspart  0-5 Units Subcutaneous QHS  . insulin glargine  60 Units Subcutaneous BID  . metoprolol tartrate  12.5 mg Oral BID  . vancomycin  1,000 mg Intravenous Q24H  . DISCONTD: vancomycin  1,000 mg Intravenous Q12H   Continuous Infusions:   . sodium chloride     PRN Meds:.acetaminophen, acetaminophen, alum & mag hydroxide-simeth, cyclobenzaprine, guaiFENesin-dextromethorphan, morphine injection, ondansetron, oxyCODONE-acetaminophen, phenol, zolpidem  Assessment/Plan: Diabetic foot ulcer/ leukocytosis/ fevers  - Was initially being managed with Zosyn and VAncomycin. Due to continued fevers, I have changed her Zosyn to Imipenem (1/3). Fevers have resolved for the time being and leukocytosis continues to improve.  - initial imaging was negative for osteomyelitis.  S/p left fem-pop and and transmetatarsal amputation on 1/3 performed by Dr Edilia Bo.  Cultures obtained during the procedure reveal group B strep.   Cont Imipenam and Vanc. If problems with Vanc levels continue, we can switch to Tygacil.   Diabetes mellitus- sugars noted to be extremely uncontrolled on admission although the patient states usually they are quite well controlled.  They have steadily improved and today are ranging below 150. I have switched her high dose sliding scale to moderate. Sugars remain stable.   Hyponatremia- likely from dehydration secondary to uncontrolled sugars. The HCTZ that she takes is probably contributing  as well. It improved with hydration. I have continued to hold HCTZ.   Hyperkalemia- Not sure why this continues to increase. Her renal function wass unchanged when it started to rise. We are holding her ARB and not providing any supplemental K+. I will resume IVF today as Creatinine has gone up. She may be developing type 4 RTA which is common in diabetics.   HTN (hypertension)- resumed Norvasc. Hold HCTZ for now. Cont to hold Cozaar due to hyperkalemia. Metoprolol added and increased to 25 bid today.  Jerking right arm- Occurred after surgery. Improved with Flexeril which is what she takes at home when this occurs.  Edema right hand- improving with elevation - likely from IVF>  GERD (gastroesophageal reflux disease)  Gout  CKD (chronic kidney disease) stage 3, GFR 30-59 ml/min- no baseline creatinine to compare this with.  Grade 2 diastolic dysfunction- noted on ECHO  on 12/30. On CCB.  Obesity, morbid  Constipation- dulcolax      LOS: 9 days   [facility] [phone] 04/07/2011, 3:42 PM

## 2011-04-07 NOTE — Progress Notes (Signed)
ANTIBIOTIC CONSULT NOTE - FOLLOW UP  Pharmacy Consult for Vancomycin Indication: Diabetic foot ulcer  Assessment: 61 y.o. F on Vancomycin/Primaxin for diabetic foot infection with a SUPRAtherapeutic Vancomycin level this afternoon (rechecked to calculate patient-specific Vanc clearance rate). The patient has not been clearing Vancomycin according to normal population kinetics. The patient's specific calculated ke is ~0.026 >> t1/2~26.7 hrs. Will expect patient to get back within 10-15 range tomorrow tomorrow evening and will redose based on patient specific kinetics at that time.   Goal of Therapy:  Vancomycin trough level 10-15 mcg/ml  Plan:  1. Will resume Vancomycin at 1g every 24 hours starting on 1/7 at 2200. 2. Will plan to recheck trough at steady state at new dose to ensure clearing appropriately. 3. Will continue to follow renal function, culture results, LOT, and antibiotic de-escalation plans   Georgina Pillion, PharmD, BCPS 04/07/2011 1:22 PM     Allergies  Allergen Reactions  . Azo (Phenazopyridine Hcl) Itching, Swelling and Rash  . Ciprofloxacin     According to MD notes pt has allergy to Cipro  . Codeine Itching and Rash    Patient Measurements: Height: 5\' 6"  (167.6 cm) Weight: 252 lb 13.9 oz (114.7 kg) IBW/kg (Calculated) : 59.3    Vital Signs: Temp: 98.3 F (36.8 C) (01/06 0448) Temp src: Oral (01/06 0448) BP: 143/74 mmHg (01/06 0448) Pulse Rate: 72  (01/06 0448) Intake/Output from previous day: 01/05 0701 - 01/06 0700 In: -  Out: 2735 [Urine:2700; Drains:35] Intake/Output from this shift: Total I/O In: 240 [P.O.:240] Out: -   Labs:  Basename 04/07/11 1127 04/05/11 1623 04/05/11 0330  WBC -- -- 10.3  HGB -- -- 7.6*  PLT -- -- 428*  LABCREA -- -- --  CREATININE 1.50* 1.40* 1.40*   Estimated Creatinine Clearance: 50.7 ml/min (by C-G formula based on Cr of 1.5).  Basename 04/07/11 1127 04/07/11 0153 04/06/11 1546  VANCOTROUGH -- -- 61.9*    VANCOPEAK -- -- --  VANCORANDOM 31.3 40.2 --  GENTTROUGH -- -- --  GENTPEAK -- -- --  GENTRANDOM -- -- --  TOBRATROUGH -- -- --  TOBRAPEAK -- -- --  TOBRARND -- -- --  AMIKACINPEAK -- -- --  AMIKACINTROU -- -- --  AMIKACIN -- -- --     Microbiology: Recent Results (from the past 720 hour(s))  MRSA PCR SCREENING     Status: Normal   Collection Time   03/30/11  3:07 AM      Component Value Range Status Comment   MRSA by PCR NEGATIVE  NEGATIVE  Final   WOUND CULTURE     Status: Normal   Collection Time   04/04/11  1:58 PM      Component Value Range Status Comment   Specimen Description WOUND FOOT LEFT   Final    Special Requests SWAB FROM LEFT FIFTH TOE   Final    Gram Stain     Final    Value: NO WBC SEEN     MODERATE SQUAMOUS EPITHELIAL CELLS PRESENT     RARE GRAM POSITIVE RODS   Culture     Final    Value: FEW GROUP B STREP(S.AGALACTIAE)ISOLATED     Note: TESTING AGAINST S. AGALACTIAE NOT ROUTINELY PERFORMED DUE TO PREDICTABILITY OF AMP/PEN/VAN SUSCEPTIBILITY.   Report Status 04/06/2011 FINAL   Final   ANAEROBIC CULTURE     Status: Normal (Preliminary result)   Collection Time   04/04/11  1:58 PM      Component Value Range  Status Comment   Specimen Description WOUND FOOT LEFT   Final    Special Requests SWAB FROM LEFT FIFTH TOE   Final    Gram Stain     Final    Value: NO WBC SEEN     MODERATE SQUAMOUS EPITHELIAL CELLS PRESENT     RARE GRAM POSITIVE RODS   Culture     Final    Value: NO ANAEROBES ISOLATED; CULTURE IN PROGRESS FOR 5 DAYS   Report Status PENDING   Incomplete     Anti-infectives     Start     Dose/Rate Route Frequency Ordered Stop   04/04/11 2200   cefUROXime (ZINACEF) 1.5 g in dextrose 5 % 50 mL IVPB  Status:  Discontinued        1.5 g 100 mL/hr over 30 Minutes Intravenous Every 12 hours 04/04/11 1658 04/04/11 1750   04/02/11 1500   vancomycin (VANCOCIN) IVPB 1000 mg/200 mL premix  Status:  Discontinued        1,000 mg 200 mL/hr over 60 Minutes  Intravenous Every 12 hours 04/02/11 1335 04/06/11 1657   04/01/11 1800   imipenem-cilastatin (PRIMAXIN) 500 mg in sodium chloride 0.9 % 100 mL IVPB        500 mg 200 mL/hr over 30 Minutes Intravenous 3 times per day 04/01/11 1610     04/01/11 1000   sulfamethoxazole-trimethoprim (BACTRIM DS) 800-160 MG per tablet 1 tablet  Status:  Discontinued        1 tablet Oral 2 times daily 04/01/11 0912 04/01/11 0942   03/30/11 2100   vancomycin (VANCOCIN) 1,500 mg in sodium chloride 0.9 % 500 mL IVPB  Status:  Discontinued        1,500 mg 250 mL/hr over 120 Minutes Intravenous Every 24 hours 03/30/11 0821 04/02/11 1335   03/30/11 0600   vancomycin (VANCOCIN) IVPB 1000 mg/200 mL premix  Status:  Discontinued        1,000 mg 200 mL/hr over 60 Minutes Intravenous Every 12 hours 03/30/11 0224 03/30/11 0818   03/30/11 0600   piperacillin-tazobactam (ZOSYN) IVPB 3.375 g  Status:  Discontinued        3.375 g 12.5 mL/hr over 240 Minutes Intravenous 3 times per day 03/30/11 0224 04/01/11 1610   03/29/11 2300   vancomycin (VANCOCIN) IVPB 1000 mg/200 mL premix        1,000 mg 200 mL/hr over 60 Minutes Intravenous  Once 03/29/11 2246 03/30/11 0023   03/29/11 2300  piperacillin-tazobactam (ZOSYN) IVPB 3.375 g       3.375 g 12.5 mL/hr over 240 Minutes Intravenous  Once 03/29/11 2246 03/30/11 0022

## 2011-04-07 NOTE — Progress Notes (Signed)
ABI completed.  Preliminary report is 0.64 on the right and 0.83 on the left. Smiley Houseman 04/07/2011, 8:15 AM

## 2011-04-08 LAB — BASIC METABOLIC PANEL
BUN: 20 mg/dL (ref 6–23)
BUN: 19 mg/dL (ref 6–23)
Calcium: 9.3 mg/dL (ref 8.4–10.5)
Calcium: 9.3 mg/dL (ref 8.4–10.5)
Creatinine, Ser: 1.47 mg/dL — ABNORMAL HIGH (ref 0.50–1.10)
GFR calc Af Amer: 43 mL/min — ABNORMAL LOW (ref >90)
GFR calc Af Amer: 48 mL/min — ABNORMAL LOW (ref >90)
GFR calc non Af Amer: 37 mL/min — ABNORMAL LOW (ref >90)
GFR calc non Af Amer: 41 mL/min — ABNORMAL LOW (ref >90)
Potassium: 5.9 mEq/L — ABNORMAL HIGH (ref 3.5–5.1)
Sodium: 137 mEq/L (ref 135–145)

## 2011-04-08 LAB — GLUCOSE, CAPILLARY
Glucose-Capillary: 51 mg/dL — ABNORMAL LOW (ref 70–99)
Glucose-Capillary: 123 mg/dL — ABNORMAL HIGH (ref 70–99)

## 2011-04-08 MED ORDER — FERROUS SULFATE 325 (65 FE) MG PO TABS
325.0000 mg | ORAL_TABLET | Freq: Two times a day (BID) | ORAL | Status: DC
  Administered 2011-04-08 – 2011-04-12 (×10): 325 mg via ORAL
  Filled 2011-04-08 (×11): qty 1

## 2011-04-08 MED ORDER — BISACODYL 5 MG PO TBEC
10.0000 mg | DELAYED_RELEASE_TABLET | Freq: Once | ORAL | Status: AC
  Administered 2011-04-08: 10 mg via ORAL

## 2011-04-08 MED ORDER — BISACODYL 10 MG RE SUPP: 10.0000 mg | Freq: Every day | RECTAL | Status: DC | PRN

## 2011-04-08 MED ORDER — INSULIN GLARGINE 100 UNIT/ML ~~LOC~~ SOLN
55.0000 [IU] | Freq: Two times a day (BID) | SUBCUTANEOUS | Status: DC
  Administered 2011-04-08: 55 [IU] via SUBCUTANEOUS

## 2011-04-08 NOTE — Progress Notes (Signed)
Inpatient Diabetes Program Recommendations  AACE/ADA: New Consensus Statement on Inpatient Glycemic Control (2009)  Target Ranges:  Prepandial:   less than 140 mg/dL      Peak postprandial:   less than 180 mg/dL (1-2 hours)      Critically ill patients:  140 - 180 mg/dL   Reason: Hypoglycemia this a.m. CBG=51  Inpatient Diabetes Program Recommendations Insulin - Basal: Decrease Lantus to 55 BID HgbA1C: Please order A1C to establish control prior to hospitalization.  Pt may need med addition and/or adjustment.

## 2011-04-08 NOTE — Progress Notes (Addendum)
[facility] [place]  Progress Note Bypass Surgery  Date of Surgery: 03/29/2011 - 04/04/2011  Procedure(s): LEFT BYPASS GRAFT FEMORAL-POPLITEAL ARTERY TMA -OPEN Surgeon: Surgeon(s): Chuck Hint, MD  4 Days Post-Op  History of Present Illness  Pamela [name] is a 61 y.o. female who is S/P Procedure(s): BYPASS GRAFT Left FEMORAL-POPLITEAL ARTERY and OPEN TMA .  The patient's wounds are healing well. Patients pain is well controlled.  Pt states she feels fairly weak and her legs are "wobbly".  VASC. LAB Studies:        ABI: Right 0.64;  Left 0.83;  Significant Diagnostic Studies:  BMET    Component Value Date/Time   NA 135 04/08/2011 0500   K 5.1 04/08/2011 0500   CL 101 04/08/2011 0500   CO2 27 04/08/2011 0500   GLUCOSE 113* 04/08/2011 0500   BUN 20 04/08/2011 0500   CREATININE 1.47* 04/08/2011 0500   CALCIUM 9.3 04/08/2011 0500   GFRNONAA 37* 04/08/2011 0500   GFRAA 43* 04/08/2011 0500     Physical Examination  BP Readings from Last 3 Encounters:  04/08/11 134/70  04/08/11 134/70  04/08/11 134/70   Temp Readings from Last 3 Encounters:  04/08/11 97.4 F (36.3 C) Oral  04/08/11 97.4 F (36.3 C) Oral  04/08/11 97.4 F (36.3 C) Oral   SpO2 Readings from Last 3 Encounters:  04/08/11 91%  04/08/11 91%  04/08/11 91%   Ht Readings from Last 3 Encounters:  03/30/11 5\' 6"  (1.676 m)  03/30/11 5\' 6"  (1.676 m)  03/30/11 5\' 6"  (1.676 m)    Pt is A&O x 3 left lower extremity: leg/groin Incision/s is/are clean,dry.intact, and  healing without hematoma, erythema or drainage Limb is warm; with good color Dressing intact to TMA  Left Dorsalis Pedis pulse is monophasic by Doppler LeftPosterior tibial pulse is  monophasic by Doppler  Assessment: Pt. Doing well Post-op pain is controlled Wounds are healing well  extremities are well perfused Post -op acute blood loss anemia (04/05/11 H/H 7.6/24) will recheck CBC and begin Iron  Plan: PT/OT for  ambulation Pulse lavage to left TMA daily Continue wound care as ordered  [place] [phone] 04/08/2011 7:52 AM  Left TMA inspected. Looks pretty good so far. Still not enough granulation tissue for VAC. Continue pulsed lavage and dressing changes. Will add hydrogel. Once granulating a little better, can apply VAC. Wound measures 12 cm length, greatest width around 4 cm. Cont PTx with Darco shoe.  Di Kindle. Edilia Bo, MD, FACS [facility] [phone] 04/08/2011

## 2011-04-08 NOTE — Progress Notes (Signed)
Notified Dr. Sharon Seller, hospitalist team 8,  that patient had been assisted to floor by the nursing tech. No new orders received at this time.

## 2011-04-08 NOTE — Progress Notes (Signed)
Physical Therapy Wound Treatment Patient Details  Name: Pamela [name] MRN: [id number] Date of Birth: [birth year]  Today's Date: 04/08/2011 Time: 7846-9629 Time Calculation (min): 33 min  Subjective     Pain Score: Pain Score:   6  Wound Assessment                                                                      Wound 04/05/11 Amputation Foot Left open incision of transmetatarsal amputation (Active)  Site / Wound Assessment Red;Yellow 04/08/2011 10:37 AM  % Wound base Red or Granulating 75% 04/08/2011 10:37 AM  % Wound base Yellow 25% 04/08/2011 10:37 AM  Peri-wound Assessment Intact 04/08/2011 10:37 AM  Wound Length (cm) 4 cm 04/05/2011  3:30 PM  Wound Width (cm) 12 cm 04/05/2011  3:30 PM  Wound Depth (cm) 1 cm 04/05/2011  3:30 PM  Drainage Amount Minimal 04/08/2011 10:37 AM  Drainage Description Serous 04/08/2011 10:37 AM  Treatment Hydrotherapy (Pulse lavage);Packing (Saline gauze) 04/08/2011 10:37 AM  Dressing Type Compression wrap;Gauze (Comment);Hydrogel;Moist to dry 04/08/2011 10:37 AM  Dressing Changed Changed 04/08/2011 10:37 AM  Dressing Status Clean;Dry;Intact 04/08/2011  8:40 AM     Hydrotherapy Pulsed lavage therapy - wound location: Lt. transmetatarsal open incision Pulsed Lavage with Suction (psi):  (4-8 psi) Pulsed Lavage with Suction - Normal Saline Used: 1000 mL Pulsed Lavage Tip: Tip with splash shield   Wound Assessment and Plan  Wound Therapy - Assess/Plan/Recommendations Wound Therapy - Clinical Statement: Wound making steady progress. Wound Therapy - Follow Up Recommendations:  (depends on dc plans.)  Wound Therapy Goals- Improve the function of patient's integumentary system by progressing the wound(s) through the phases of wound healing (inflammation - proliferation - remodeling) by: Decrease Necrotic Tissue - Progress: Progressing toward goal Increase Granulation Tissue - Progress: Progressing toward goal  Goals will be updated until maximal potential  achieved or discharge criteria met.  Discharge criteria: when goals achieved, discharge from hospital, MD decision/surgical intervention, no progress towards goals, refusal/missing three consecutive treatments without notification or medical reason.  Jonny Dearden 04/08/2011, 10:50 AM  Skip Mayer PT [phone]

## 2011-04-08 NOTE — Progress Notes (Signed)
Called to room by nurse tech. Patient had slid to floor during transfer from [facility] to bed assisted by nurse tech.. Pt back in bed at this time. Patient denies any injury, stating "I didn't hit nothing." vss. Will continue to monitor. Pt instructed to continue calling for assistance when she needs to get OOB.

## 2011-04-08 NOTE — Progress Notes (Signed)
Subjective: History reviewed in detail.  The patient is currently resting comfortably.  She reports the pain in her foot is well controlled.  She denies fevers chills shortness breath nausea vomiting or abdominal pain.  She does admit to feeling very weak and having difficulty ambulating.  She is aware that she would not be able to be discharged straight home to independent living setting.  Objective: Weight change:   Intake/Output Summary (Last 24 hours) at 04/08/11 1643 Last data filed at 04/08/11 1500  Gross per 24 hour  Intake   1620 ml  Output   2050 ml  Net   -430 ml   Blood pressure 129/74, pulse 79, temperature 98.8 F (37.1 C), temperature source Oral, resp. rate 18, height 5\' 6"  (1.676 m), weight 114.7 kg (252 lb 13.9 oz), SpO2 95.00%.  Physical Exam: General: No acute respiratory distress Lungs: Clear to auscultation bilaterally without wheezes or crackles Cardiovascular: Regular rate and rhythm without murmur gallop or rub normal S1 and S2 Abdomen: Nontender, nondistended, soft, bowel sounds positive, no rebound, no ascites, no appreciable mass Extremities: No significant cyanosis, clubbing, or edema bilateral lower extremities-left foot is dressed and dry at the present time  Lab Results:  Basename 04/08/11 0859 04/08/11 0500 04/07/11 1127  NA 137 135 136  K 5.9* 5.1 5.5*  CL 103 101 101  CO2 25 27 27   GLUCOSE 130* 113* 105*  BUN 19 20 20   CREATININE 1.36* 1.47* 1.50*  CALCIUM 9.3 9.3 9.3  MG -- -- --  PHOS -- -- --   Micro Results: Recent Results (from the past 240 hour(s))  MRSA PCR SCREENING     Status: Normal   Collection Time   03/30/11  3:07 AM      Component Value Range Status Comment   MRSA by PCR NEGATIVE  NEGATIVE  Final   WOUND CULTURE     Status: Normal   Collection Time   04/04/11  1:58 PM      Component Value Range Status Comment   Specimen Description WOUND FOOT LEFT   Final    Special Requests SWAB FROM LEFT FIFTH TOE   Final    Gram  Stain     Final    Value: NO WBC SEEN     MODERATE SQUAMOUS EPITHELIAL CELLS PRESENT     RARE GRAM POSITIVE RODS   Culture     Final    Value: FEW GROUP B STREP(S.AGALACTIAE)ISOLATED     Note: TESTING AGAINST S. AGALACTIAE NOT ROUTINELY PERFORMED DUE TO PREDICTABILITY OF AMP/PEN/VAN SUSCEPTIBILITY.   Report Status 04/06/2011 FINAL   Final   ANAEROBIC CULTURE     Status: Normal (Preliminary result)   Collection Time   04/04/11  1:58 PM      Component Value Range Status Comment   Specimen Description WOUND FOOT LEFT   Final    Special Requests SWAB FROM LEFT FIFTH TOE   Final    Gram Stain     Final    Value: NO WBC SEEN     MODERATE SQUAMOUS EPITHELIAL CELLS PRESENT     RARE GRAM POSITIVE RODS   Culture     Final    Value: NO ANAEROBES ISOLATED; CULTURE IN PROGRESS FOR 5 DAYS   Report Status PENDING   Incomplete    Assessment/Plan:  L Diabetic foot ulcer Now status post transmetatarsal amputation with fem-pop bypass - culture positive for group B strep only-narrow antibiotics-vascular surgery continued to follow  Diabetes Had an  isolated episode of hypoglycemia-we'll gently decrease Lantus and follow CBGs closely-check A1c  Hyponatremia Resolved  Hyperkalemia  Etiology unclear-followup in a.m.-may require Kayexalate-possibly due to tissue death in setting of diabetic foot infection  Hypertension Continue to follow trend-no change in treatment plan today  IV fluid infiltration right hand much improved on exam today   Chronic kidney disease stage III Creatinine slowly improving-follow trend  Diastolic congestive heart failure Well compensated at the present time  Morbid obesity  Chronic constipation  Disposition Will need placement within a skilled nursing facility for rehabilitation and wound care   LOS: 10 days  04/08/2011, 4:43 PM  Lonia Blood, MD [facility] Office  [phone] Pager [phone]  On-Call/Text Page:      [email or web address]       password [facility]

## 2011-04-08 NOTE — Progress Notes (Signed)
Pt showing no signs and symptoms of injury from earlier fall. Pt continues to ambulate to [facility] with 2 assist, has no complaints of pain, and denies difficulty due to fall. Will continue to monitor pt.

## 2011-04-09 LAB — HEMOGLOBIN AND HEMATOCRIT, BLOOD: HCT: 26.5 % — ABNORMAL LOW (ref 36.0–46.0)

## 2011-04-09 LAB — TYPE AND SCREEN: Unit division: 0

## 2011-04-09 LAB — CBC
HCT: 22.1 % — ABNORMAL LOW (ref 36.0–46.0)
Hemoglobin: 6.6 g/dL — CL (ref 12.0–15.0)
MCV: 98.7 fL (ref 78.0–100.0)
RBC: 2.24 MIL/uL — ABNORMAL LOW (ref 3.87–5.11)
RDW: 13.9 % (ref 11.5–15.5)
WBC: 8.7 10*3/uL (ref 4.0–10.5)

## 2011-04-09 LAB — BASIC METABOLIC PANEL
BUN: 18 mg/dL (ref 6–23)
CO2: 27 mEq/L (ref 19–32)
Calcium: 8.9 mg/dL (ref 8.4–10.5)
Creatinine, Ser: 1.39 mg/dL — ABNORMAL HIGH (ref 0.50–1.10)
GFR calc non Af Amer: 40 mL/min — ABNORMAL LOW (ref >90)
Glucose, Bld: 72 mg/dL (ref 70–99)

## 2011-04-09 LAB — IRON AND TIBC
Iron: 39 ug/dL — ABNORMAL LOW (ref 42–135)
Saturation Ratios: 23 % (ref 20–55)
TIBC: 173 ug/dL — ABNORMAL LOW (ref 250–470)

## 2011-04-09 LAB — FERRITIN: Ferritin: 464 ng/mL — ABNORMAL HIGH (ref 10–291)

## 2011-04-09 LAB — GLUCOSE, CAPILLARY: Glucose-Capillary: 81 mg/dL (ref 70–99)

## 2011-04-09 LAB — ANAEROBIC CULTURE

## 2011-04-09 MED ORDER — INSULIN GLARGINE 100 UNIT/ML ~~LOC~~ SOLN
55.0000 [IU] | Freq: Every day | SUBCUTANEOUS | Status: DC
  Administered 2011-04-09: 55 [IU] via SUBCUTANEOUS

## 2011-04-09 MED ORDER — INSULIN GLARGINE 100 UNIT/ML ~~LOC~~ SOLN
50.0000 [IU] | Freq: Every day | SUBCUTANEOUS | Status: DC
  Administered 2011-04-09: 50 [IU] via SUBCUTANEOUS
  Filled 2011-04-09: qty 3

## 2011-04-09 NOTE — Progress Notes (Signed)
Utilization review completed.  

## 2011-04-09 NOTE — Progress Notes (Signed)
CRITICAL VALUE ALERT  Critical value received: HBG 6.6  Date of notification: 04/09/2011  Time of notification: 0700  Critical value read back:YES  Nurse who received alert: Davontae Prusinski RN  MD notified (1st page): KIRBY  Time of first page: 0700  MD notified (2nd page):  Time of second page:  Responding NW:GNFAO  Time MD responded:  [phone]

## 2011-04-09 NOTE — Progress Notes (Signed)
Subjective: No complaints. Walking to the commode without significant pain in foot.   Objective: Weight change:   Intake/Output Summary (Last 24 hours) at 04/09/11 0838 Last data filed at 04/09/11 0611  Gross per 24 hour  Intake   2540 ml  Output   1100 ml  Net   1440 ml   Blood pressure 117/71, pulse 57, temperature 97.6 F (36.4 C), temperature source Oral, resp. rate 19, height 5\' 6"  (1.676 m), weight 114.7 kg (252 lb 13.9 oz), SpO2 97.00%.  Physical Exam: General: No acute respiratory distress Lungs: Clear to auscultation bilaterally without wheezes or crackles Cardiovascular: Regular rate and rhythm without murmur gallop or rub normal S1 and S2 Abdomen: Nontender, nondistended, soft, bowel sounds positive, no rebound, no ascites, no appreciable mass Extremities: No significant cyanosis, clubbing, or edema bilateral lower extremities-left foot is dressed and dry at the present time  Lab Results:  Basename 04/09/11 0533 04/08/11 0859 04/08/11 0500  NA 139 137 135  K 5.3* 5.9* 5.1  CL 106 103 101  CO2 27 25 27   GLUCOSE 72 130* 113*  BUN 18 19 20   CREATININE 1.39* 1.36* 1.47*  CALCIUM 8.9 9.3 9.3  MG -- -- --  PHOS -- -- --   Micro Results: Recent Results (from the past 240 hour(s))  WOUND CULTURE     Status: Normal   Collection Time   04/04/11  1:58 PM      Component Value Range Status Comment   Specimen Description WOUND FOOT LEFT   Final    Special Requests SWAB FROM LEFT FIFTH TOE   Final    Gram Stain     Final    Value: NO WBC SEEN     MODERATE SQUAMOUS EPITHELIAL CELLS PRESENT     RARE GRAM POSITIVE RODS   Culture     Final    Value: FEW GROUP B STREP(S.AGALACTIAE)ISOLATED     Note: TESTING AGAINST S. AGALACTIAE NOT ROUTINELY PERFORMED DUE TO PREDICTABILITY OF AMP/PEN/VAN SUSCEPTIBILITY.   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 - culture positive for group B strep only-Imipenam-vascular surgery continued to follow  Diabetes Had an isolated episode of hypoglycemia-AM sugar still a little low- we'll gently decrease evening Lantus and follow CBGs closely-check A1c  Hyponatremia Resolved  Hyperkalemia  Etiology unclear-following closely.-may require Kayexalate-possibly due to tissue death in setting of diabetic foot infection  Hypertension Continue to follow trend-no change in treatment plan today  IV fluid infiltration right hand much improved on exam today   Chronic kidney disease stage III Creatinine slowly improving-follow trend  Diastolic congestive heart failure Well compensated at the present time  Morbid obesity  Chronic constipation  Disposition Will need placement  within a skilled nursing facility for rehabilitation and wound care   LOS: 11 days  04/09/2011, 8:38 AM  Lonia Blood, MD [facility] Office  [phone] Pager [phone]  On-Call/Text Page:      [email or web address]      password [facility]

## 2011-04-09 NOTE — Progress Notes (Addendum)
Procedure(s) (LRB): BYPASS GRAFT FEMORAL-POPLITEAL ARTERY (Left) AMPUTATION DIGIT (Left) 5 Days Post-Op   Subjective: Patient reports pain as mild.  Ambl. To toilet with protective shoe on independently.  Objective:   VITALS:  @VITALSIGNS @  Intact pulses distally No cellulitis present PT/Ant. tib and peroneal pulses via doppler audible.  LABS Lab Results  Component Value Date   HGB 6.6* 04/09/2011   HGB 7.6* 04/05/2011   HGB 9.5* 04/01/2011   Lab Results  Component Value Date   WBC 8.7 04/09/2011   Lab Results  Component Value Date   INR 1.09 04/03/2011   Lab Results  Component Value Date   NA 139 04/09/2011   K 5.3* 04/09/2011   CL 106 04/09/2011   CO2 27 04/09/2011   BUN 18 04/09/2011   CREATININE 1.39* 04/09/2011   GLUCOSE 72 04/09/2011     Assessment/Plan: S/P left fem/pop- TMA wounds is healing well. Anemia question source several days post -op.   Patient typed and screened yesterday.  Question transfusions per med. service. Up with therapy. Continue wound care.  COLLINS,EMMA MAUREEN 04/09/2011, 8:43 AM   Agree with above Di Kindle. Edilia Bo, MD, FACS Beeper [phone] 04/09/2011

## 2011-04-09 NOTE — Progress Notes (Signed)
Physical Therapy Wound Treatment Patient Details  Name: Pamela [name] MRN: [id number] Date of Birth: 1950/09/22  Today's Date: 04/09/2011 Time: 1000-1018 Time Calculation (min): 18 min  Subjective  Subjective: "My blood's low," pt stated.  Pain Score: Pain Score: 0-No pain  Wound Assessment                                                                      Wound 04/05/11 Amputation Foot Left open incision of transmetatarsal amputation (Active)  Site / Wound Assessment Red;Yellow 04/09/2011 11:24 AM  % Wound base Red or Granulating 75% 04/09/2011 11:24 AM  % Wound base Yellow 25% 04/09/2011 11:24 AM  Peri-wound Assessment Intact 04/09/2011 11:24 AM  Wound Length (cm) 4 cm 04/05/2011  3:30 PM  Wound Width (cm) 12 cm 04/05/2011  3:30 PM  Wound Depth (cm) 1 cm 04/05/2011  3:30 PM  Drainage Amount Minimal 04/09/2011 11:24 AM  Drainage Description Serous 04/09/2011 11:24 AM  Treatment Hydrotherapy (Pulse lavage);Debridement (Selective);Packing (Saline gauze) 04/09/2011 11:24 AM  Dressing Type Compression wrap;Gauze (Comment);Hydrogel;Moist to dry 04/09/2011 11:24 AM  Dressing Changed Changed 04/09/2011 11:24 AM  Dressing Status Clean;Dry;Intact 04/08/2011  8:40 AM  Hydrotherapy Pulsed lavage therapy - wound location: Lt. transmetatarsal open incision Pulsed Lavage with Suction (psi): 4 psi (4-8 psi) Pulsed Lavage with Suction - Normal Saline Used: 1000 mL Pulsed Lavage Tip: Tip with splash shield Selective Debridement Selective Debridement - Location: lt. open transmetarsal amputation Selective Debridement - Tools Used: Forceps;Scissors Selective Debridement - Tissue Removed: yellow slough   Wound Assessment and Plan  Wound Therapy - Assess/Plan/Recommendations Wound Therapy - Clinical Statement: No change in wound.  Wound Therapy Goals- Improve the function of patient's integumentary system by progressing the wound(s) through the phases of wound healing (inflammation - proliferation -  remodeling) by: Decrease Necrotic Tissue - Progress: Progressing toward goal Increase Granulation Tissue - Progress: Progressing toward goal  Goals will be updated until maximal potential achieved or discharge criteria met.  Discharge criteria: when goals achieved, discharge from hospital, MD decision/surgical intervention, no progress towards goals, refusal/missing three consecutive treatments without notification or medical reason.  Nial Hawe 04/09/2011, 11:36 AM  Skip Mayer PT [phone]

## 2011-04-09 NOTE — Progress Notes (Signed)
Physical Therapy Treatment Patient Details Name: Pamela [name] MRN: [id number] DOB: Jun 02, 1950 Today's Date: 04/09/2011  PT Assessment/Plan  PT - Assessment/Plan Comments on Treatment Session: Pt with L toe amp. Pt very pleasant and willing to attempt mobility even though low Hgb and activity limited because pt became symptomatic. Pt very motivated to progress with activity and although pt reports she felt some jerking of RUE none visable.  PT Plan: Discharge plan remains appropriate;Frequency remains appropriate PT Goals  Acute Rehab PT Goals PT Goal: Rolling Supine to Right Side - Progress: Progressing toward goal PT Goal: Supine/Side to Sit - Progress: Progressing toward goal PT Goal: Sit to Supine/Side - Progress: Progressing toward goal Pt will go Sit to Stand: with modified independence PT Goal: Sit to Stand - Progress: Updated due to goal met Pt will go Stand to Sit: with modified independence PT Goal: Stand to Sit - Progress: Updated due to goals met Pt will Transfer Bed to Chair/Chair to Bed: with modified independence PT Transfer Goal: Bed to Chair/Chair to Bed - Progress: Revised (modified due to lack of progress/goal met) PT Goal: Ambulate - Progress: Progressing toward goal  PT Treatment Precautions/Restrictions  Precautions Precautions: Fall Precaution Comments: Patient with jerking movements of extremities impacting mobility and safety.  Bil knees buckling Restrictions Weight Bearing Restrictions: Yes LUE Weight Bearing:  (entered info for wrong extremity) LLE Weight Bearing: Partial weight bearing Other Position/Activity Restrictions: Darko shoe LLE; weight bearing on heel of left foot only Mobility (including Balance) Bed Mobility Supine to Sit: 6: Modified independent (Device/Increase time);With rails;HOB elevated (Comment degrees) (HOB 30degrees) Sitting - Scoot to Edge of Bed: 6: Modified independent (Device/Increase time) Transfers Sit to Stand: 5:  Supervision;From bed Sit to Stand Details (indicate cue type and reason): cueing for hand placement and safety Stand to Sit: 4: Min assist;To chair/3-in-1 Stand to Sit Details: minguard with cueing for hand placement and control of descent Ambulation/Gait Ambulation/Gait: Yes Ambulation/Gait Assistance: 4: Min assist Ambulation/Gait Assistance Details (indicate cue type and reason): minguard for safety secondary to decreased Hgb and periods of jerking Ambulation Distance (Feet): 4 Feet Assistive device: Rolling walker Gait Pattern: Step-to pattern;Trunk flexed;Decreased step length - left;Decreased step length - right Stairs: No  Posture/Postural Control Posture/Postural Control: No significant limitations Balance Balance Assessed: No Exercise  General Exercises - Lower Extremity Long Arc Quad: AROM;Both;20 reps;Seated Hip ABduction/ADduction: AROM;Both;20 reps;Seated Toe Raises: AROM;Both;20 reps;Seated End of Session PT - End of Session Equipment Utilized During Treatment: Gait belt Activity Tolerance: Patient limited by fatigue Patient left: in chair;with call bell in reach Nurse Communication: Mobility status for transfers;Mobility status for ambulation General Behavior During Session: [facility] for tasks performed Cognition: [facility] for tasks performed  Delorse Lek 04/09/2011, 9:49 AM Toney Sang, PT [phone]

## 2011-04-09 NOTE — Progress Notes (Signed)
Painted pt's left leg incision with betadine.

## 2011-04-10 LAB — CBC
HCT: 27.1 % — ABNORMAL LOW (ref 36.0–46.0)
MCH: 30.2 pg (ref 26.0–34.0)
MCV: 97.5 fL (ref 78.0–100.0)
RDW: 14.6 % (ref 11.5–15.5)
WBC: 10.3 10*3/uL (ref 4.0–10.5)

## 2011-04-10 LAB — BASIC METABOLIC PANEL
BUN: 17 mg/dL (ref 6–23)
CO2: 27 mEq/L (ref 19–32)
Chloride: 105 mEq/L (ref 96–112)
Creatinine, Ser: 1.35 mg/dL — ABNORMAL HIGH (ref 0.50–1.10)
Glucose, Bld: 53 mg/dL — ABNORMAL LOW (ref 70–99)

## 2011-04-10 LAB — GLUCOSE, CAPILLARY
Glucose-Capillary: 71 mg/dL (ref 70–99)
Glucose-Capillary: 51 mg/dL — ABNORMAL LOW (ref 70–99)
Glucose-Capillary: 75 mg/dL (ref 70–99)
Glucose-Capillary: 152 mg/dL — ABNORMAL HIGH (ref 70–99)

## 2011-04-10 MED ORDER — INSULIN GLARGINE 100 UNIT/ML ~~LOC~~ SOLN: 40.0000 [IU] | Freq: Every day | SUBCUTANEOUS | Status: DC

## 2011-04-10 MED ORDER — INSULIN GLARGINE 100 UNIT/ML ~~LOC~~ SOLN
30.0000 [IU] | Freq: Every day | SUBCUTANEOUS | Status: DC
  Administered 2011-04-10: 30 [IU] via SUBCUTANEOUS

## 2011-04-10 MED ORDER — INSULIN GLARGINE 100 UNIT/ML ~~LOC~~ SOLN
30.0000 [IU] | Freq: Every day | SUBCUTANEOUS | Status: DC
  Administered 2011-04-11 – 2011-04-12 (×2): 30 [IU] via SUBCUTANEOUS

## 2011-04-10 NOTE — Progress Notes (Signed)
Pt CBG 51. Pt said her head "feels funny" otherwise asymptomatic. Pt awake and alert, no sweating. Gave pt crackers, apple juice. Recheck CBG 58. Gave pt skim milk and more crackers. Will recheck CBG in 15 minutes.

## 2011-04-10 NOTE — Progress Notes (Signed)
.  Clinical social worker completed patient psychosocial assessment, please see assessment in patient shadow chart. Pt is open to short term rehab in [place]. Pt states she and her family live in [place] and she would like to be close to family. .Clinical social worker initiated skilled nursing facility search, see placement note in patient shadow chart. CSW completed FL2 for md signature and will place in pt shadow chart. .Clinical social worker continuing to follow pt to assist with pt dc plans and further csw needs.    Catha Gosselin, Theresia Majors  [phone] . 04/09/2011 12pm

## 2011-04-10 NOTE — Progress Notes (Signed)
VASCULAR PROGRESS NOTE  SUBJECTIVE: Fell last night. No specific complaints.  PHYSICAL EXAM: Filed Vitals:   04/09/11 1638 04/09/11 2117 04/10/11 0045 04/10/11 0538  BP: 109/66 142/73 171/81 134/63  Pulse: 68 67 87 73  Temp: 98 F (36.7 C) 97.6 F (36.4 C) 97.4 F (36.3 C) 98.5 F (36.9 C)  TempSrc: Oral Oral Oral Oral  Resp: 16 18 20 14   Height:      Weight:      SpO2: 96% 96% 95% 95%   Lungs: clear to auscultation Brisk Doppler signals in her left foot. Left TMA inspected: Still not ready for VAC that wound looks reasonable.  LABS: Lab Results  Component Value Date   WBC 8.7 04/09/2011   HGB 8.1* 04/09/2011   HCT 26.5* 04/09/2011   MCV 98.7 04/09/2011   PLT 444* 04/09/2011   Lab Results  Component Value Date   CREATININE 1.39* 04/09/2011   Lab Results  Component Value Date   INR 1.09 04/03/2011   CBG (last 3)   Basename 04/10/11 4098 04/10/11 1191 04/09/11 2121  GLUCAP 58* 51* 105*   ASSESSMENT/PLAN: 1. 6 Days Post-Op s/p: LEFT FEMORAL-POPLITEAL BYPASS AND OPEN LEFT TRANSMETATARSAL AMPUTATION. 2. Continue pulse lavage and dressing changes with hydrogel. Possibly apply VAC later in the week versus home with dressing changes and having the VAC placed as an outpatient. 3. Continue physical therapy. Partial weight-bearing left foot heel only with Darco shoe.  Waverly Ferrari, MD, FACS Beeper: [phone] 04/10/2011

## 2011-04-10 NOTE — Progress Notes (Signed)
Monitor tech saw that pt rolled out of bed and alerted nursing staff. Staff ran into room and pt found on the floor sitting upright. Pt stated that she was dreaming she had to "pee" and rolled out of bed. Obtained vital signs: BP 171/81, 95% on RA, 87 HR, and 97.4 temp. Pt stated she was in no pain. Pt had urinated on the floor. No head injury noted, and pt denied hitting head. Upon assessment, no bruising was found, pt neurologically intact. Pt alert and oriented. Assisted pt back into the bed and continued to assess. Pt unharmed, and continued to deny any pain. Redressed pt's L foot where it had a small amount of blood from the fall. Assessed foot, unharmed, sutures intact. Dressing now clean dry and intact. Bed was in low position, call bell was in reach, pt had been calling for help to the [facility] all previous shift and this shift. Pt aware that she needed to call for help to use the restroom. After repositioning the pt, reinforced the need to call for help and placed the call bell back within reach, also turned bed alarm on. MD Edilia Bo) made aware and no further orders were given. Will continue to monitor.

## 2011-04-10 NOTE — Progress Notes (Signed)
Physical Therapy Wound Treatment Patient Details  Name: Pamela [name] MRN: [id number] Date of Birth: 1950-10-22  Today's Date: 04/10/2011 Time: 4098-1191 Time Calculation (min): 20 min  Subjective  Subjective: "How does it look?" pt asked about foot.  Pain Score: Pain Score:   9  Wound Assessment  Wound 03/30/11 Diabetic ulcer Foot Left Left foot, 4th - 5th toes (Active)                                                                    Wound 04/05/11 Amputation Foot Left open incision of transmetatarsal amputation (Active)  Site / Wound Assessment Pink;Yellow 04/10/2011 10:46 AM  % Wound base Red or Granulating 70% 04/10/2011  1:30 AM  % Wound base Yellow 30% 04/09/2011 11:24 AM  Peri-wound Assessment Intact 04/10/2011 10:46 AM  Wound Length (cm) 4 cm 04/05/2011  3:30 PM  Wound Width (cm) 12 cm 04/05/2011  3:30 PM  Wound Depth (cm) 1 cm 04/05/2011  3:30 PM  Drainage Amount Scant 04/10/2011 10:46 AM  Drainage Description Serous 04/10/2011 10:46 AM  Treatment Hydrotherapy (Pulse lavage);Debridement (Selective);Packing (Saline gauze) 04/10/2011 10:46 AM  Dressing Type Compression wrap;Gauze (Comment);Hydrogel;Moist to dry 04/10/2011 10:46 AM  Dressing Changed Changed 04/10/2011 10:46 AM  Dressing Status Clean;Dry;Intact 04/10/2011  1:30 AM     Hydrotherapy Pulsed lavage therapy - wound location: lt transmetatarsal open incision Pulsed Lavage with Suction (psi): 8 psi (4-8 psi) Pulsed Lavage with Suction - Normal Saline Used: 1000 mL Pulsed Lavage Tip: Tip with splash shield Selective Debridement Selective Debridement - Location: lt. foot Selective Debridement - Tools Used: Forceps;Scissors Selective Debridement - Tissue Removed: minimal yellow slough   Wound Assessment and Plan  Wound Therapy - Assess/Plan/Recommendations Wound Therapy - Clinical Statement: More yellow slough appearing. Wound Therapy - Follow Up Recommendations: Skilled nursing facility  Wound Therapy Goals- Improve  the function of patient's integumentary system by progressing the wound(s) through the phases of wound healing (inflammation - proliferation - remodeling) by: Decrease Necrotic Tissue - Progress: Other (comment) (not progressing) Increase Granulation Tissue - Progress: Other (comment) (not progressing)  Goals will be updated until maximal potential achieved or discharge criteria met.  Discharge criteria: when goals achieved, discharge from hospital, MD decision/surgical intervention, no progress towards goals, refusal/missing three consecutive treatments without notification or medical reason.  Pamela [name] 04/10/2011, 10:50 AM  Skip Mayer PT [phone]

## 2011-04-10 NOTE — Progress Notes (Signed)
Subjective: Patient is resting comfortably today.  She denies acute problems.  She specifically denies chest pain fevers chills nausea vomiting or shortness of breath.  Objective: Weight change:   Intake/Output Summary (Last 24 hours) at 04/10/11 1533 Last data filed at 04/10/11 1300  Gross per 24 hour  Intake   1080 ml  Output    800 ml  Net    280 ml   Blood pressure 134/67, pulse 69, temperature 98 F (36.7 C), temperature source Oral, resp. rate 18, height 5\' 6"  (1.676 m), weight 114.7 kg (252 lb 13.9 oz), SpO2 93.00%.  Physical Exam: General: No acute respiratory distress Lungs: Clear to auscultation bilaterally without wheezes or crackles Cardiovascular: Regular rate and rhythm without murmur gallop or rub normal S1 and S2 Abdomen: Nontender, nondistended, soft, bowel sounds positive, no rebound, no ascites, no appreciable mass Extremities: No significant cyanosis, clubbing, or edema bilateral lower extremities-left foot is dressed and dry at the present time  Lab Results:  Basename 04/10/11 0630 04/09/11 0533 04/08/11 0859  NA 139 139 137  K 5.5* 5.3* 5.9*  CL 105 106 103  CO2 27 27 25   GLUCOSE 53* 72 130*  BUN 17 18 19   CREATININE 1.35* 1.39* 1.36*  CALCIUM 9.4 8.9 9.3  MG -- -- --  PHOS -- -- --   Micro Results: Recent Results (from the past 240 hour(s))  WOUND CULTURE     Status: Normal   Collection Time   04/04/11  1:58 PM      Component Value Range Status Comment   Specimen Description WOUND FOOT LEFT   Final    Special Requests SWAB FROM LEFT FIFTH TOE   Final    Gram Stain     Final    Value: NO WBC SEEN     MODERATE SQUAMOUS EPITHELIAL CELLS PRESENT     RARE GRAM POSITIVE RODS   Culture     Final    Value: FEW GROUP B STREP(S.AGALACTIAE)ISOLATED     Note: TESTING AGAINST S. AGALACTIAE NOT ROUTINELY PERFORMED DUE TO PREDICTABILITY OF AMP/PEN/VAN SUSCEPTIBILITY.   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 - culture positive for group B strep only-narrow antibiotics-vascular surgery continues to follow  Diabetes Continues to have episodes of hypoglycemia - we are adjusting her insulin regimen and we'll continue to follow CBGs  Hyponatremia Resolved  Hyperkalemia  Etiology unclear-followup in am-appears to be stable for now-possibly due to tissue death in setting of diabetic foot infection  Hypertension Continue to follow trend-no change in treatment plan today  IV fluid infiltration right hand Essentially resolved  Chronic kidney disease stage III Creatinine stable  Diastolic congestive heart failure Well compensated at the present time  Morbid obesity  Chronic constipation  Disposition Will need placement within a  skilled nursing facility for rehabilitation and wound care-probable discharge Thursday or Friday at the latest depending upon CBG readings   LOS: 12 days  04/10/2011, 3:33 PM  Lonia Blood, MD [facility] Office  [phone] Pager [phone]  On-Call/Text Page:      [email or web address]      password [facility]

## 2011-04-10 NOTE — Progress Notes (Signed)
Physical Therapy Treatment Patient Details Name: Pamela [name] MRN: [id number] DOB: November 10, 1950 Today's Date: 04/10/2011  PT Assessment/Plan  PT - Assessment/Plan Comments on Treatment Session: Pt with lt. transmetatarsal amputation.  Pt with significant myoclonic jerks making mobility dificult due to safety issues.  2nd person present in room for safety. PT Plan: Discharge plan remains appropriate;Frequency remains appropriate PT Frequency: Min 3X/week Follow Up Recommendations: Skilled nursing facility Equipment Recommended: Defer to next venue PT Goals  Acute Rehab PT Goals PT Goal: Supine/Side to Sit - Progress:  (Not progressing due to myoclonic jerks) PT Goal: Sit to Stand - Progress: Not Progressing (due to myoclonic jerks) PT Goal: Stand to Sit - Progress: Not progressing (due to myoclonic jerks) PT Transfer Goal: Bed to Chair/Chair to Bed - Progress:  (not progressing due to myoclonic jerks) PT Goal: Ambulate - Progress:  (not progressing due to myoclonic jerks)  PT Treatment Precautions/Restrictions  Precautions Precautions: Fall Precaution Comments: Patient with jerking movements of extremities impacting mobility and safety.  Bil knees buckling Restrictions Weight Bearing Restrictions: Yes LUE Weight Bearing:  (entered info for wrong extremity) LLE Weight Bearing: Partial weight bearing Other Position/Activity Restrictions: Darko shoe LLE; weight bearing on heel of left foot only Mobility (including Balance) Bed Mobility Supine to Sit: 4: Min assist;HOB elevated (Comment degrees) ( Assist bringing up trunk due to myoclonic jerks.) Sitting - Scoot to Edge of Bed: 5: Supervision Transfers Sit to Stand: 3: Mod assist Sit to Stand Details (indicate cue type and reason): pt insisted on pulling up on walker despite cues Stand to Sit: 4: Min assist;To chair/3-in-1 Stand to Sit Details: verbal/tactile cues to reach for chair Stand Pivot Transfers: 4: Min assist Stand Pivot  Transfer Details (indicate cue type and reason): bed to 3 in 1 to recliner.     Exercise    End of Session PT - End of Session Equipment Utilized During Treatment: Gait belt Activity Tolerance:  (Pt limited by myoclonic jerks) Patient left: in chair;with call bell in reach Nurse Communication: Mobility status for transfers General Behavior During Session: [facility] for tasks performed Cognition: [facility] for tasks performed  [facility] 04/10/2011, 11:05 AM [facility] PT [phone]

## 2011-04-10 NOTE — Progress Notes (Signed)
Patient discussed at the Long Length of Stay Pamela [name] Weeks 04/10/2011  

## 2011-04-10 NOTE — Progress Notes (Signed)
Pt's CBG 75. Report gave to on-coming shift and notified to monitor CBG closely.

## 2011-04-11 LAB — CBC
HCT: 26.6 % — ABNORMAL LOW (ref 36.0–46.0)
Hemoglobin: 8.2 g/dL — ABNORMAL LOW (ref 12.0–15.0)
MCH: 30.4 pg (ref 26.0–34.0)
MCHC: 30.8 g/dL (ref 30.0–36.0)

## 2011-04-11 LAB — BASIC METABOLIC PANEL
BUN: 17 mg/dL (ref 6–23)
Chloride: 103 mEq/L (ref 96–112)
Glucose, Bld: 70 mg/dL (ref 70–99)
Potassium: 5.4 mEq/L — ABNORMAL HIGH (ref 3.5–5.1)

## 2011-04-11 LAB — GLUCOSE, CAPILLARY
Glucose-Capillary: 74 mg/dL (ref 70–99)
Glucose-Capillary: 139 mg/dL — ABNORMAL HIGH (ref 70–99)
Glucose-Capillary: 164 mg/dL — ABNORMAL HIGH (ref 70–99)

## 2011-04-11 MED ORDER — INSULIN GLARGINE 100 UNIT/ML ~~LOC~~ SOLN
26.0000 [IU] | Freq: Every day | SUBCUTANEOUS | Status: DC
  Administered 2011-04-11: 26 [IU] via SUBCUTANEOUS

## 2011-04-11 MED ORDER — AMOXICILLIN 500 MG PO CAPS
500.0000 mg | ORAL_CAPSULE | Freq: Three times a day (TID) | ORAL | Status: DC
  Administered 2011-04-11 – 2011-04-12 (×4): 500 mg via ORAL
  Filled 2011-04-11 (×6): qty 1

## 2011-04-11 NOTE — Progress Notes (Signed)
Clinical social worker presented bed offer to patient of [facility], however facility rescended bed offer due to availability. .Clinical social worker continuing to follow pt to assist with pt dc plans and further csw needs.    Catha Gosselin, Theresia Majors  [phone] .04/11/2011 10:49am

## 2011-04-11 NOTE — Progress Notes (Signed)
Physical Therapy Wound Treatment Patient Details  Name: Pamela [name] MRN: [id number] Date of Birth: 03/07/1951  Today's Date: 04/11/2011 Time: 0900-0921 Time Calculation (min): 21 min  Subjective  Subjective: "I might be going home soon," pt stated  Pain Score: Pain Score:   4 (during hydrotherapy)  Wound Assessment                                                                      Wound 04/05/11 Amputation Foot Left open incision of transmetatarsal amputation (Active)  Site / Wound Assessment Yellow;Pink 04/11/2011  9:26 AM  % Wound base Red or Granulating 50% 04/11/2011  9:26 AM  % Wound base Yellow 50% 04/11/2011  9:26 AM  Peri-wound Assessment Intact 04/10/2011 10:46 AM  Wound Length (cm) 4 cm 04/05/2011  3:30 PM  Wound Width (cm) 12 cm 04/05/2011  3:30 PM  Wound Depth (cm) 1 cm 04/05/2011  3:30 PM  Drainage Amount Moderate 04/11/2011  9:26 AM  Drainage Description Serous 04/10/2011 10:46 AM  Treatment Hydrotherapy (Pulse lavage) 04/11/2011  9:26 AM  Dressing Type Hydrogel;Moist to dry;Gauze (Comment);Compression wrap 04/11/2011  9:26 AM  Dressing Changed Changed 04/11/2011  9:26 AM  Dressing Status Clean;Dry;Intact 04/10/2011  1:30 AM     Hydrotherapy Pulsed lavage therapy - wound location: lt transmetatarsal open incision Pulsed Lavage with Suction (psi): 8 psi Pulsed Lavage with Suction - Normal Saline Used: 1000 mL Pulsed Lavage Tip: Tip with splash shield   Wound Assessment and Plan  Wound Therapy - Assess/Plan/Recommendations Wound Therapy - Clinical Statement: More yellow slough continues to cover wound bed. Wound Therapy - Follow Up Recommendations: Skilled nursing facility  Wound Therapy Goals- Improve the function of patient's integumentary system by progressing the wound(s) through the phases of wound healing (inflammation - proliferation - remodeling) by: Decrease Necrotic Tissue - Progress: Other (comment) (not progressing) Increase Granulation Tissue -  Progress: Other (comment) (not progressing)  Goals will be updated until maximal potential achieved or discharge criteria met.  Discharge criteria: when goals achieved, discharge from hospital, MD decision/surgical intervention, no progress towards goals, refusal/missing three consecutive treatments without notification or medical reason.  Sequoyah Ramone 04/11/2011, 9:30 AM  Skip Mayer PT [phone]

## 2011-04-11 NOTE — Progress Notes (Signed)
VASCULAR PROGRESS NOTE  SUBJECTIVE: No complaints this morning. Has been ambulating in the room but not in halls.  PHYSICAL EXAM: Filed Vitals:   04/10/11 1000 04/10/11 1340 04/10/11 2206 04/11/11 0517  BP: 156/72 134/67 142/74 157/78  Pulse:  69 69 67  Temp:  98 F (36.7 C) 97.6 F (36.4 C) 97.6 F (36.4 C)  TempSrc:  Oral Oral Oral  Resp:  18 19 20   Height:      Weight:    259 lb 7.7 oz (117.7 kg)  SpO2:  93% 94% 95%   Lungs: clear to auscultation Palpable left popliteal pulse. Incisions look fine.  LABS: Lab Results  Component Value Date   WBC 8.6 04/11/2011   HGB 8.2* 04/11/2011   HCT 26.6* 04/11/2011   MCV 98.5 04/11/2011   PLT 382 04/11/2011   Lab Results  Component Value Date   CREATININE 1.37* 04/11/2011   Lab Results  Component Value Date   INR 1.09 04/03/2011   CBG (last 3)   Basename 04/11/11 0640 04/10/11 2208 04/10/11 1606  GLUCAP 74 107* 88     ASSESSMENT/PLAN: 1. 7 Days Post-Op s/p: LEFT FEMORAL-POPLITEAL BYPASS AND OPEN TMA. 2. ID: Patient has been on Primaxin for 10 days. At this point think it is safe to change to po antibiotics if okay with primary service. Her intraoperative culture grew few group B strep.  3. I do not think that she will be ready for placement of a negative pressure wound sponge for another week probably. This reason I agreed it would be safe for her to be discharged with wet-to-dry dressing changes and we can arrange to put on a negative pressure wound sponge as an outpatient. 4. Agree that she would be ready for discharge in the next day or 2.  5. Continue physical therapy for now. 6. Continue pulse lavage while she is in the hospital.  Waverly Ferrari, MD, FACS Beeper: [phone] 04/11/2011

## 2011-04-12 ENCOUNTER — Encounter (HOSPITAL_COMMUNITY): Payer: Self-pay | Admitting: Dietician

## 2011-04-12 LAB — GLUCOSE, CAPILLARY
Glucose-Capillary: 103 mg/dL — ABNORMAL HIGH (ref 70–99)
Glucose-Capillary: 100 mg/dL — ABNORMAL HIGH (ref 70–99)

## 2011-04-12 MED ORDER — CARRASYN HYDROGEL WOUND DRESS EX GEL: CUTANEOUS | Status: AC | PRN

## 2011-04-12 MED ORDER — ONDANSETRON HCL 4 MG PO TABS: 4.0000 mg | ORAL_TABLET | Freq: Four times a day (QID) | ORAL | Status: AC | PRN

## 2011-04-12 MED ORDER — METOPROLOL TARTRATE 25 MG PO TABS: 25.0000 mg | ORAL_TABLET | Freq: Two times a day (BID) | ORAL | Status: AC

## 2011-04-12 MED ORDER — INSULIN ASPART 100 UNIT/ML ~~LOC~~ SOLN: 0.0000 [IU] | Freq: Every day | SUBCUTANEOUS | Status: AC

## 2011-04-12 MED ORDER — FERROUS SULFATE 325 (65 FE) MG PO TABS: 325.0000 mg | ORAL_TABLET | Freq: Two times a day (BID) | ORAL | Status: AC

## 2011-04-12 MED ORDER — INSULIN GLARGINE 100 UNIT/ML ~~LOC~~ SOLN: 30.0000 [IU] | Freq: Every day | SUBCUTANEOUS | Status: AC

## 2011-04-12 MED ORDER — ALUM & MAG HYDROXIDE-SIMETH 200-200-20 MG/5ML PO SUSP: 30.0000 mL | Freq: Four times a day (QID) | ORAL | Status: AC | PRN

## 2011-04-12 MED ORDER — INSULIN GLARGINE 100 UNIT/ML ~~LOC~~ SOLN: 26.0000 [IU] | Freq: Every day | SUBCUTANEOUS | Status: AC

## 2011-04-12 MED ORDER — DSS 100 MG PO CAPS: 100.0000 mg | ORAL_CAPSULE | Freq: Two times a day (BID) | ORAL | Status: AC

## 2011-04-12 MED ORDER — ZOLPIDEM TARTRATE 5 MG PO TABS: 5.0000 mg | ORAL_TABLET | Freq: Every evening | ORAL | Status: AC | PRN

## 2011-04-12 MED ORDER — BISACODYL 10 MG RE SUPP: 10.0000 mg | Freq: Every day | RECTAL | Status: AC | PRN

## 2011-04-12 MED ORDER — INSULIN ASPART 100 UNIT/ML ~~LOC~~ SOLN: 0.0000 [IU] | Freq: Three times a day (TID) | SUBCUTANEOUS | Status: AC

## 2011-04-12 MED ORDER — ACETAMINOPHEN 325 MG PO TABS: 650.0000 mg | ORAL_TABLET | Freq: Four times a day (QID) | ORAL | Status: AC | PRN

## 2011-04-12 MED ORDER — ASPIRIN 81 MG PO TBEC: 81.0000 mg | DELAYED_RELEASE_TABLET | Freq: Every day | ORAL | Status: AC

## 2011-04-12 MED ORDER — OXYCODONE-ACETAMINOPHEN 5-325 MG PO TABS: 1.0000 | ORAL_TABLET | ORAL | Status: AC | PRN

## 2011-04-12 MED ORDER — GUAIFENESIN-DM 100-10 MG/5ML PO SYRP: 15.0000 mL | ORAL_SOLUTION | ORAL | Status: AC | PRN

## 2011-04-12 MED ORDER — AMOXICILLIN 500 MG PO CAPS: 500.0000 mg | ORAL_CAPSULE | Freq: Three times a day (TID) | ORAL | Status: AC

## 2011-04-12 MED ORDER — CYCLOBENZAPRINE HCL 10 MG PO TABS: 10.0000 mg | ORAL_TABLET | Freq: Three times a day (TID) | ORAL | Status: AC | PRN

## 2011-04-12 NOTE — Progress Notes (Signed)
Physical Therapy Wound Treatment Patient Details  Name: Masaye Gatchalian MRN: [id number] Date of Birth: 12/03/1950  Today's Date: 04/12/2011 Time: 1478-2956 Time Calculation (min): 20 min  Subjective  Subjective: "I'm leaving today," pt stated.  Pain Score: Pain Score:   0  Wound Assessment                                                                      Wound 04/05/11 Amputation Foot Left open incision of transmetatarsal amputation (Active)  Site / Wound Assessment Yellow;Pink 04/12/2011  9:12 AM  % Wound base Red or Granulating 50% 04/12/2011  9:12 AM  % Wound base Yellow 50% 04/12/2011  9:12 AM  Peri-wound Assessment Intact 04/10/2011 10:46 AM  Wound Length (cm) 4 cm 04/12/2011  9:12 AM  Wound Width (cm) 12 cm 04/12/2011  9:12 AM  Wound Depth (cm) 1 cm 04/12/2011  9:12 AM  Drainage Amount Moderate 04/12/2011  9:12 AM  Drainage Description Serous 04/12/2011  9:12 AM  Treatment Hydrotherapy (Pulse lavage) 04/12/2011  9:12 AM  Dressing Type Hydrogel;Moist to dry;Gauze (Comment);Compression wrap 04/12/2011  9:12 AM  Dressing Changed Changed 04/12/2011  9:12 AM  Dressing Status Clean;Dry;Intact 04/10/2011  1:30 AM      Hydrotherapy Pulsed lavage therapy - wound location: lt transmetatarsal open incision Pulsed Lavage with Suction (psi): 8 psi (8-12 psi) Pulsed Lavage with Suction - Normal Saline Used: 1000 mL Pulsed Lavage Tip: Tip with splash shield   Wound Assessment and Plan  Wound Therapy - Assess/Plan/Recommendations Wound Therapy - Clinical Statement: No change from yesterday.  May benefit from Santyl to debride yellow slough. Hydrotherapy Plan: Debridement;Dressing change;Pulsatile lavage with suction;Patient/family education Wound Therapy - Frequency: 6X / week Wound Therapy - Follow Up Recommendations: Skilled nursing facility  Wound Therapy Goals- Improve the function of patient's integumentary system by progressing the wound(s) through the phases of wound  healing (inflammation - proliferation - remodeling) by: Decrease Necrotic Tissue to: 25% Decrease Necrotic Tissue - Progress: Revised (modified due to lack of progress/goal met) Increase Granulation Tissue to: 75% Increase Granulation Tissue - Progress: Revised (modified due to lack of progress/goal met) Wound Therapy - Potential for Goals: Fair  Goals will be updated until maximal potential achieved or discharge criteria met.  Discharge criteria: when goals achieved, discharge from hospital, MD decision/surgical intervention, no progress towards goals, refusal/missing three consecutive treatments without notification or medical reason.  Naydelin Ziegler 04/12/2011, 9:18 AM  Skip Mayer PT [phone]

## 2011-04-12 NOTE — Progress Notes (Signed)
Pt smokes 1 ppd and says with the lord's help she quit the day she came in here. Plans to stay quit. Encouraged pt to remain tobacco free. Her entire family smokes but she states they go outside to smoke. Doesn't want any med aids to help with quitting. Referred to 1-800 quit now for f/u and support. Discussed oral fixation substitutes, second hand smoke and in home smoking policy. Reviewed and gave pt Written education/contact information.

## 2011-04-12 NOTE — Discharge Summary (Addendum)
DISCHARGE SUMMARY  Pamela [name]  MR#: [id number]  DOB:1950-04-30  Date of Admission: 03/29/2011 Date of Discharge: 04/12/2011  Attending Physician:MCCLUNG,JEFFREY T  Patient's PCP:No primary provider on file/pt does not live in [place], [place]  Consults:  Chuck Hint, MD-Vascluar Surgery  Discharge Diagnoses: Present on Admission:  .Diabetes mellitus .Diabetic foot ulcer .Obesities, morbid .Hyponatremia .HTN (hypertension) .GERD (gastroesophageal reflux disease) .CKD (chronic kidney disease) stage 3, GFR 30-59 ml/min .PAD (peripheral artery disease)    Current Discharge Medication List    START taking these medications   Details  acetaminophen (TYLENOL) 325 MG tablet Take 2 tablets (650 mg total) by mouth every 6 (six) hours as needed (or Fever >/= 101). Qty: 30 tablet    alum & mag hydroxide-simeth (MAALOX/MYLANTA) 200-200-20 MG/5ML suspension Take 30 mLs by mouth every 6 (six) hours as needed (dyspepsia). Qty: 355 mL    amoxicillin (AMOXIL) 500 MG capsule Take 1 capsule (500 mg total) by mouth every 8 (eight) hours.    aspirin EC 81 MG EC tablet Take 1 tablet (81 mg total) by mouth daily.    bisacodyl (DULCOLAX) 10 MG suppository Place 1 suppository (10 mg total) rectally daily as needed. Qty: 12 suppository    docusate sodium 100 MG CAPS Take 100 mg by mouth 2 (two) times daily. Qty: 10 capsule    ferrous sulfate 325 (65 FE) MG tablet Take 1 tablet (325 mg total) by mouth 2 (two) times daily with a meal.    guaiFENesin-dextromethorphan (ROBITUSSIN DM) 100-10 MG/5ML syrup Take 15 mLs by mouth every 4 (four) hours as needed for cough. Qty: 118 mL    metoprolol tartrate (LOPRESSOR) 25 MG tablet Take 1 tablet (25 mg total) by mouth 2 (two) times daily.    ondansetron (ZOFRAN) 4 MG tablet Take 1 tablet (4 mg total) by mouth every 6 (six) hours as needed for nausea. Qty: 20 tablet    oxyCODONE-acetaminophen (PERCOCET) 5-325 MG per tablet Take 1-2  tablets by mouth every 4 (four) hours as needed. Qty: 30 tablet, Refills: 0    zolpidem (AMBIEN) 5 MG tablet Take 1 tablet (5 mg total) by mouth at bedtime as needed for sleep (insomnia). Qty: 30 tablet, Refills: 0      CONTINUE these medications which have CHANGED   Details  cyclobenzaprine (FLEXERIL) 10 MG tablet Take 1 tablet (10 mg total) by mouth 3 (three) times daily as needed for muscle spasms. Qty: 30 tablet, Refills: 0    !! insulin aspart (NOVOLOG) 100 UNIT/ML injection Inject 0-15 Units into the skin 3 (three) times daily with meals. Qty: 1 vial    !! insulin aspart (NOVOLOG) 100 UNIT/ML injection Inject 0-5 Units into the skin at bedtime. Qty: 1 vial    !! insulin glargine (LANTUS) 100 UNIT/ML injection Inject 26 Units into the skin at bedtime. Qty: 10 mL    !! insulin glargine (LANTUS) 100 UNIT/ML injection Inject 30 Units into the skin daily. Qty: 10 mL     !! - Potential duplicate medications found. Please discuss with provider.    CONTINUE these medications which have NOT CHANGED   Details  allopurinol (ZYLOPRIM) 300 MG tablet Take 300 mg by mouth daily.      amLODipine (NORVASC) 10 MG tablet Take 10 mg by mouth daily.      atorvastatin (LIPITOR) 10 MG tablet Take 10 mg by mouth daily.      Cyanocobalamin (VITAMIN B-12 PO) Take 1 tablet by mouth daily.      ergocalciferol (  VITAMIN D2) 50000 UNITS capsule Take 50,000 Units by mouth once a week.     gabapentin (NEURONTIN) 800 MG tablet Take 800 mg by mouth 3 (three) times daily.      omega-3 acid ethyl esters (LOVAZA) 1 G capsule Take 4 g by mouth daily.        STOP taking these medications     doxycycline (VIBRAMYCIN) 100 MG capsule      hydrochlorothiazide (HYDRODIURIL) 25 MG tablet      hydrOXYzine (ATARAX/VISTARIL) 25 MG tablet      losartan (COZAAR) 100 MG tablet      oxycodone-acetaminophen (ROXICET) 5-500 MG per tablet      promethazine (PHENERGAN) 25 MG tablet       sulfamethoxazole-trimethoprim (BACTRIM DS) 800-160 MG per tablet          Hospital Course: Present on Admission:   Pamela [name] is an 61 y.o. female who presents to the ED with complaints of worsening redness and drainage of a wound on her left foot. She reports having an ulcer on the bottom of her left foot which has been healing slowly, but 1 week ago the top of her foot developed a blood blister that continued to worsen. She saw her podiatrist 1 week ago, but another ulcer began to develop. The redness progressed and the ulcer on the top of her left foot began to have purulent drainage. She reports having fevers off and on for the past week as well, and her blood sugars have been elevated. In the Emergency Department she was found to have a glucose level of 782.  The patient was admitted to the acute units.  An insulin drip was utilized to obtain the initial control of her blood sugars.  She also required volume resuscitation to address acute on chronic renal insufficiency.  Vascular surgery was consulted and followed the patient throughout her hospital course.  Plain film x-ray was accomplished which raised the question of probable osteomyelitis affecting a number the patient's digits.  An aortogram with bilateral iliac arteriogram was accomplished on 04/01/2011.  This revealed occlusion of the superficial femoral artery at its origin with reconstitution of the distal above knee popliteal artery.    Cardiology was consulted and it was felt patient was stable for surgical intervention.  The medical team continued to attend to the patient's multiple medical problems.  On 04/04/2011 the patient was taken to the OR where a left femoral to above-the-knee popliteal artery bypass was carried out with a translocated saphenous vein graft.  A transmetatarsal amputation was also carried out.  The patient tolerated the procedure well.  Her postoperative medical care was attended to by the hospitalist  service.  Her postoperative wound care was attended to by the vascular surgery service.  The only significant postop medical complication was that of hypoglycemia.  This was able to be addressed with gradual adjustments in her insulin regimen.  At the present time the patient is deemed to be medically stable.  The vascular surgery service is pleased with the progression of her wound.  It is felt that she will likely require a VAC for ultimate wound closure but the wound is not felt to be at the stage where this would be appropriate at this time.  The plan for now is to continue twice a day wet-to-dry dressing changes as detailed in the discharge orders.  The patient is to followup with her vascular surgeon late next week to determine if it is yet time to  begin [facility] therapy.  .Diabetes mellitus Currently well-controlled with adjustments in regimen  .Diabetic foot ulcer See discussion above concerning definitive treatment  .Obesities, morbid Counseled patient on need for significant weight loss   .Hyponatremia Due to hypovolemia-resolved   .HTN (hypertension) Currently well-controlled medical therapy   .GERD (gastroesophageal reflux disease)  .CKD (chronic kidney disease) stage 3, GFR 30-59 ml/min Creatinine is 1.37 at time of discharge  .PAD (peripheral artery disease) See discussion above   Hyperkalemia During the patient's stay she has had episodic hyperkalemia with potassium averaging 5-5.5.  There have been no appreciable EKG changes associated with this.  There is no clear etiology.  It is quite possible that this is simply "normal" for this patient.  It is recommended that her potassium be followed on a routine basis in the outpatient setting and that careful consideration be given before resuming or initiating any medications which could potentially further elevate her potassium.   Day of Discharge BP 156/78  Pulse 71  Temp(Src) 97.8 F (36.6 C) (Oral)  Resp 16  Ht 5\' 6"   (1.676 m)  Wt 121.1 kg (266 lb 15.6 oz)  BMI 43.09 kg/m2  SpO2 95%  Physical Exam: General: No acute respiratory distress Lungs: Clear to auscultation bilaterally without wheezes or crackles Cardiovascular: Regular rate and rhythm without murmur gallop or rub normal S1 and S2 Abdomen: Nontender, nondistended, soft, bowel sounds positive, no rebound, no ascites, no appreciable mass Extremities: No significant cyanosis, clubbing, or edema bilateral lower extremities   Results for orders placed during the hospital encounter of 03/29/11 (from the past 24 hour(s))  GLUCOSE, CAPILLARY     Status: Abnormal   Collection Time   04/11/11  4:06 PM      Component Value Range   Glucose-Capillary 164 (*) 70 - 99 (mg/dL)   Comment 1 Notify RN     Comment 2 Documented in Chart    GLUCOSE, CAPILLARY     Status: Abnormal   Collection Time   04/11/11  8:56 PM      Component Value Range   Glucose-Capillary 127 (*) 70 - 99 (mg/dL)  GLUCOSE, CAPILLARY     Status: Normal   Collection Time   04/12/11  3:13 AM      Component Value Range   Glucose-Capillary 75  70 - 99 (mg/dL)  GLUCOSE, CAPILLARY     Status: Abnormal   Collection Time   04/12/11  6:17 AM      Component Value Range   Glucose-Capillary 116 (*) 70 - 99 (mg/dL)   Comment 1 Notify RN    GLUCOSE, CAPILLARY     Status: Abnormal   Collection Time   04/12/11 11:21 AM      Component Value Range   Glucose-Capillary 103 (*) 70 - 99 (mg/dL)    Disposition: Discharge to skilled nursing facility for ongoing rehabilitation and wound care   Follow-up Appts:  Discharge Orders    Future Orders Please Complete By Expires   Diet Carb Modified         Follow-up Information    Follow up with DICKSON,CHRISTOPHER S, MD. Call today. (shcedule an appointment with Dr. Edilia Bo for late next week.)    Contact information:   [address] [place] [address] [phone]          Tests Needing Follow-up: Potassium should be checked  within the next 7 days  Time spent in discharge (includes decision making & examination of pt): >30 minutes  Signed: MCCLUNG,JEFFREY T  04/12/2011, 12:57 PM   Addendum: Pt. Upper thigh harvest site wound dehisced, is clean and needs  hydrogel and wet to dry dressing in upper thigh wound  F/U 2 weeks Dr. Edilia Bo, MD

## 2011-04-12 NOTE — Progress Notes (Signed)
[facility] [place]  Progress Note Bypass Surgery  Date of Surgery: 03/29/2011 - 04/04/2011  Procedure(s): BYPASS GRAFT FEMORAL-POPLITEAL ARTERY AMPUTATION DIGIT Surgeon: Surgeon(s): Chuck Hint, MD  8 Days Post-Op  History of Present Illness  Pamela [name] is a 61 y.o. female who is S/P Procedure(s): BYPASS GRAFT FEMORAL-POPLITEAL ARTERY AMPUTATION DIGIT left.  . Patients pain is well controlled.    Significant Diagnostic Studies: CBC    Component Value Date/Time   WBC 8.6 04/11/2011 0515   RBC 2.70* 04/11/2011 0515   HGB 8.2* 04/11/2011 0515   HCT 26.6* 04/11/2011 0515   PLT 382 04/11/2011 0515   MCV 98.5 04/11/2011 0515   MCH 30.4 04/11/2011 0515   MCHC 30.8 04/11/2011 0515   RDW 14.3 04/11/2011 0515   LYMPHSABS 2.8 03/29/2011 2259   MONOABS 1.9* 03/29/2011 2259   EOSABS 0.0 03/29/2011 2259   BASOSABS 0.1 03/29/2011 2259    BMET    Component Value Date/Time   NA 137 04/11/2011 0515   K 5.4* 04/11/2011 0515   CL 103 04/11/2011 0515   CO2 29 04/11/2011 0515   GLUCOSE 70 04/11/2011 0515   BUN 17 04/11/2011 0515   CREATININE 1.37* 04/11/2011 0515   CALCIUM 9.5 04/11/2011 0515   GFRNONAA 41* 04/11/2011 0515   GFRAA 47* 04/11/2011 0515    COAG Lab Results  Component Value Date   INR 1.09 04/03/2011   No results found for this basename: PTT    Physical Examination  BP Readings from Last 3 Encounters:  04/12/11 156/78  04/12/11 156/78  04/12/11 156/78   Temp Readings from Last 3 Encounters:  04/12/11 97.8 F (36.6 C) Oral  04/12/11 97.8 F (36.6 C) Oral  04/12/11 97.8 F (36.6 C) Oral   SpO2 Readings from Last 3 Encounters:  04/12/11 94%  04/12/11 94%  04/12/11 94%   Pulse Readings from Last 3 Encounters:  04/12/11 71  04/12/11 71  04/12/11 71    Pt is A&O x 3 left lower extremity: Incision/s is/are clean,dry.intact, and  healing without hematoma, erythema or drainage Limb is warm; with good color TMA wound dressing  dry Assessment: Pt. Doing well Post-op pain is controlled Wounds are clean, dry, intact Bypass is open and extremities are well perfused  Plan: cont pulse levage to left TMA - possible wound vac late next week PT/OT for ambulation Continue wound care as ordered  [facility] J [phone] 04/12/2011 8:10 AM

## 2011-04-12 NOTE — Progress Notes (Signed)
Physical Therapy Treatment Patient Details Name: Pamela [name] MRN: [id number] DOB: 02-07-1951 Today's Date: 04/12/2011  PT Assessment/Plan  PT - Assessment/Plan Comments on Treatment Session: Pt with L transmetatarsal amputation. Pt limited today by decreased O2 sats to 88% on RA with ambulation and preoccupation with returning to room to watch Scooby Doo. Pt refused further ambulation or attempts at exercise but encouraged to continue increasing activity as lack of jerking movements allows. Pt without any noticeable jerks today. Tech present throughout for safety. Pt currently upset about not being with son on his birthday. Pt resistant to education for appropriate RW use and states she will not step into RW at home. PT Plan: Discharge plan remains appropriate;Frequency remains appropriate Follow Up Recommendations: Skilled nursing facility PT Goals  Acute Rehab PT Goals PT Goal: Supine/Side to Sit - Progress: Progressing toward goal PT Goal: Sit to Supine/Side - Progress: Progressing toward goal PT Goal: Sit to Stand - Progress: Progressing toward goal PT Goal: Stand to Sit - Progress: Progressing toward goal PT Transfer Goal: Bed to Chair/Chair to Bed - Progress: Progressing toward goal Pt will Ambulate: >150 feet;with supervision PT Goal: Ambulate - Progress: Revised (modified due to lack of progress/goal met)  PT Treatment Precautions/Restrictions  Precautions Precautions: Fall Precaution Comments: Patient with jerking movements of extremities impacting mobility and safety.  Bil knees buckling Restrictions Weight Bearing Restrictions: Yes LUE Weight Bearing:  (entered info for wrong extremity) LLE Weight Bearing: Partial weight bearing LLE Partial Weight Bearing Percentage or Pounds: heel only with Darko Other Position/Activity Restrictions: Darko shoe LLE; weight bearing on heel of left foot only Mobility (including Balance) Bed Mobility Supine to Sit: With rails;6: Modified  independent (Device/Increase time);HOB elevated (Comment degrees) (HOB 25degrees) Sitting - Scoot to Edge of Bed: 6: Modified independent (Device/Increase time) Transfers Sit to Stand: From bed;4: Min assist Sit to Stand Details (indicate cue type and reason): minguard for safety pt pulling up on RW despite cues Stand to Sit: 4: Min assist;To chair/3-in-1 Stand to Sit Details: Cueing for hand placement and safety. Pt sitting before fully backing to chair and not reaching for armrests despite cueing Ambulation/Gait Ambulation/Gait: Yes Ambulation/Gait Assistance: 4: Min assist Ambulation/Gait Assistance Details (indicate cue type and reason): Minguard for safety with VC throughout for stepping into RW, heel weight bearing and directional cues. +1 to follow with chair for safety secondary to potential of pt jerking Ambulation Distance (Feet): 160 Feet Assistive device: Rolling walker Gait Pattern: Step-through pattern;Decreased stride length;Trunk flexed Stairs: No  Posture/Postural Control Posture/Postural Control: No significant limitations Balance Balance Assessed: No Exercise  General Exercises - Lower Extremity Long Arc Quad: AROM;Both;10 reps;Seated Hip ABduction/ADduction: AROM;15 reps;Both;Seated Hip Flexion/Marching: AROM;Both;Other reps (comment);Seated (15reps) End of Session PT - End of Session Equipment Utilized During Treatment: Gait belt Activity Tolerance: Patient tolerated treatment well Patient left: in chair;with call bell in reach Nurse Communication: Mobility status for transfers General Behavior During Session: [employer] for tasks performed Cognition: Impaired Cognitive Impairment: Pt with decreased safety awareness   Delorse Lek 04/12/2011, 9:53 AM Toney Sang, PT [phone]

## 2011-04-12 NOTE — Progress Notes (Signed)
Clinical social worker presented patient with new bed offers. Pt plans to dc to [facility] in [place], [place]..Clinical social worker assisted with patient discharge to skilled nursing facility. .Patient transportation provided by [employer] [facility] [employer] with patient chart copy. .No further Clinical Social Work needs, signing off.    Catha Gosselin, Theresia Majors  [phone] .04/12/2011 14:45pm

## 2011-04-12 NOTE — Progress Notes (Signed)
Pt was ambulating with assistance and walker to bathroom. Pt sat down and noticed bleeding coming from left leg. Incision from femoral popliteal (8 days post-op) was spilt open. Pressure held and pressure dressing applied. Della Goo, [place] notified and is coming to assess patient. Will continue to monitor. Dion Saucier

## 2011-04-12 NOTE — Progress Notes (Signed)
[place], [place] ordered wet to dry dressing on left upper thigh with hydrogel. Dressing applied. Continued to monitor site. Discharge still in place. No s/s of distress. Pt had no further questions. Ambulance called to transport to [facility] in [place], [place]. Pt discharge instructions sent with ambulance. Dion Saucier

## 2011-04-17 ENCOUNTER — Encounter: Payer: Self-pay | Admitting: Vascular Surgery

## 2011-04-23 ENCOUNTER — Encounter: Payer: Self-pay | Admitting: Vascular Surgery

## 2011-04-24 ENCOUNTER — Ambulatory Visit (INDEPENDENT_AMBULATORY_CARE_PROVIDER_SITE_OTHER): Payer: Medicaid - Out of State | Admitting: Vascular Surgery

## 2011-04-24 ENCOUNTER — Encounter: Payer: Self-pay | Admitting: Vascular Surgery

## 2011-04-24 VITALS — BP 126/73 | HR 80 | Temp 98.3°F | Resp 16 | Ht 68.0 in | Wt 244.0 lb

## 2011-04-24 DIAGNOSIS — S98919A Complete traumatic amputation of unspecified foot, level unspecified, initial encounter: Secondary | ICD-10-CM

## 2011-04-24 DIAGNOSIS — Z89439 Acquired absence of unspecified foot: Secondary | ICD-10-CM

## 2011-04-24 DIAGNOSIS — T8189XA Other complications of procedures, not elsewhere classified, initial encounter: Secondary | ICD-10-CM | POA: Insufficient documentation

## 2011-04-24 DIAGNOSIS — Z9889 Other specified postprocedural states: Secondary | ICD-10-CM

## 2011-04-24 DIAGNOSIS — Z95828 Presence of other vascular implants and grafts: Secondary | ICD-10-CM

## 2011-04-24 NOTE — Assessment & Plan Note (Signed)
This patient had an extensive gangrenous wound of her left foot and underwent revascularization an open transmetatarsal amputation on the left. She is currently in a skilled nursing facility and having dressing changes done twice a day. The wound is starting to show some granulation and at this point I think it is reasonable to place a negative pressure dressing at -125 cm of suction to be changed 3 times a week. The wound measures 6 cm in maximum diameter and approximately 12 cm in length. Think without in negative pressure dressing this wound would likely not be salvageable. Of note her bypass graft appears to be functioning well. Plan on seeing her back in 3 weeks.

## 2011-04-24 NOTE — Assessment & Plan Note (Signed)
This patient underwent a left femoral to above-knee pop 2 artery bypass because of infrainguinal arterial occlusive disease and an extensive diabetic foot infection. Her bypass graft appears to be patent with good perfusion to her open transmetatarsal amputation site. On seeing her back in 3 weeks to follow her foot wound. She'll likely need ABIs in her graft duplex in approximately 5 months. She does state that she has quit smoking.

## 2011-04-24 NOTE — Progress Notes (Signed)
Vascular and Vein Specialist of [facility]  Patient name: Pamela [name] MRN: [id number] DOB: October 01, 1950 Sex: female  REASON FOR VISIT: follow up after left lower extreme bypass and open transmetatarsal amputation.  HPI: Pamela [name] is a 61 y.o. female fluid presented with an extensive diabetic foot infection. It was felt that her only chance for limb salvage was attempted revascularization and amputation of the infected toes. She underwent a left femoral to above-knee popliteal artery bypass graft with a vein graft and  An open transmetatarsal amputation on 04/04/2011. The wound has not granulated enough to place a VAC in the patient has been in a skilled nursing facility getting some physical therapy. She comes in for outpatient visit. She states that the dressings are being done twice a day. She's getting some physical therapy.  Past Medical History  Diagnosis Date  . Diabetes mellitus   . GERD (gastroesophageal reflux disease)   . Gout   . Hypertension   . High cholesterol   . Arthritis   . COPD (chronic obstructive pulmonary disease)    REVIEW OF SYSTEMS: Arly.Keller ] denotes positive finding; [  ] denotes negative finding  CARDIOVASCULAR:  [ ]  chest pain   [ ]  chest pressure   [ ]  palpitations   [ ]  orthopnea   [ ]  dyspnea on exertion   [ ]  claudication   [ ]  rest pain   [ ]  DVT   [ ]  phlebitis PULMONARY:   [ ]  productive cough   [ ]  asthma   [ ]  wheezing CONSTITUTIONAL:  [ ]  fever   [ ]  chills  PHYSICAL EXAM: Filed Vitals:   04/24/11 1333  BP: 126/73  Pulse: 80  Temp: 98.3 F (36.8 C)  TempSrc: Oral  Resp: 16  Height: 5\' 8"  (1.727 m)  Weight: 244 lb (110.678 kg)  SpO2: 91%   Body mass index is 37.10 kg/(m^2). GENERAL: The patient is a well-nourished female, in no acute distress. The vital signs are documented above. CARDIOVASCULAR: There is a regular rate and rhythm without significant murmur appreciated.  PULMONARY: There is good air exchange bilaterally without wheezing  or rales. Her left groin incision and above the knee incision are healing well. As one small wound in the medial thigh where she had a drain that is granulating. Transmetatarsal amputation site had some fatty tissue which I debrided at the bedside. She's starting to show reasonable granulation. The wound measures approximately 6 cm in maximum width and 12 cm in length.  DATA:  Lab Results  Component Value Date   CREATININE 1.37* 04/11/2011   Lab Results  Component Value Date   HGBA1C 8.9* 04/09/2011   MEDICAL ISSUES:  S/P transmetatarsal amputation of foot This patient had an extensive gangrenous wound of her left foot and underwent revascularization an open transmetatarsal amputation on the left. She is currently in a skilled nursing facility and having dressing changes done twice a day. The wound is starting to show some granulation and at this point I think it is reasonable to place a negative pressure dressing at -125 cm of suction to be changed 3 times a week. The wound measures 6 cm in maximum diameter and approximately 12 cm in length. Think without in negative pressure dressing this wound would likely not be salvageable. Of note her bypass graft appears to be functioning well. Plan on seeing her back in 3 weeks.  S/P femoral-popliteal bypass surgery This patient underwent a left femoral to above-knee pop 2 artery bypass because  of infrainguinal arterial occlusive disease and an extensive diabetic foot infection. Her bypass graft appears to be patent with good perfusion to her open transmetatarsal amputation site. On seeing her back in 3 weeks to follow her foot wound. She'll likely need ABIs in her graft duplex in approximately 5 months. She does state that she has quit smoking.   She will need close follow up of her diabetes given her elevated hemoglobin A1c. I have discussed this with her.  DICKSON,CHRISTOPHER S Vascular and Vein Specialists of [place] [facility]: [phone]

## 2011-05-15 ENCOUNTER — Ambulatory Visit: Payer: Medicaid - Out of State | Admitting: Vascular Surgery

## 2011-05-28 ENCOUNTER — Encounter: Payer: Self-pay | Admitting: Vascular Surgery

## 2011-05-29 ENCOUNTER — Ambulatory Visit (INDEPENDENT_AMBULATORY_CARE_PROVIDER_SITE_OTHER): Payer: PRIVATE HEALTH INSURANCE | Admitting: Vascular Surgery

## 2011-05-29 ENCOUNTER — Encounter: Payer: Self-pay | Admitting: Vascular Surgery

## 2011-05-29 VITALS — BP 133/52 | HR 71 | Temp 98.2°F | Resp 18 | Ht 68.0 in | Wt 253.0 lb

## 2011-05-29 DIAGNOSIS — T8189XA Other complications of procedures, not elsewhere classified, initial encounter: Secondary | ICD-10-CM

## 2011-05-29 DIAGNOSIS — Z48812 Encounter for surgical aftercare following surgery on the circulatory system: Secondary | ICD-10-CM

## 2011-05-29 NOTE — Progress Notes (Signed)
Vascular and Vein Specialist of [facility]  Patient name: Pamela [name] MRN: [id number] DOB: 1950/05/14 Sex: female  REASON FOR VISIT: follow up of left transmetatarsal amputation  HPI: Pamela [name] is a 61 y.o. female good presented with an extensive diabetic foot infection of the left foot. She underwent a left femoral to above-knee popliteal artery bypass with a vein graft in an open left transmetatarsal amputation on 04/04/2011. Last saw her in the office on 04/24/2011. At that time the wound measured 6 cm in maximum diameter by 12 cm in length. I recommended the VAC be placed as an outpatient. Her bypass graft was patent. He comes in for a three-week follow up visit.  The tubing for the fact has potentially cause 2 pressure wounds on the dorsum of her left foot. The family also reports that the [facility] has been not functioning properly intermittently. She's really had no fever or significant pain in the foot. She had a culture done at [facility] which showed staph aureus in Acinetobacter from drainage of the left foot. She has been on Bactrim but was started on IV merem at the nursing facility. He has an IV in her right hand.  REVIEW OF SYSTEMS: Arly.Keller ] denotes positive finding; [  ] denotes negative finding  CARDIOVASCULAR:  [ ]  chest pain   [ ]  chest pressure   [ ]  palpitations   [ ]  orthopnea   CONSTITUTIONAL:  [ ]  fever   [ ]  chills  PHYSICAL EXAM: Filed Vitals:   05/29/11 1346  BP: 133/52  Pulse: 71  Temp: 98.2 F (36.8 C)  TempSrc: Oral  Resp: 18  Height: 5\' 8"  (1.727 m)  Weight: 253 lb (114.76 kg)  SpO2: 89%   Body mass index is 38.47 kg/(m^2). GENERAL: The patient is a well-nourished female, in no acute distress. The vital signs are documented above. Is moderate swelling of the left leg. The foot is warm. The wound has some serous drainage. The transmetatarsal amputation site which is been opened measures 4 cm in maximum width by approximately 12 cm in  length. She's develop 2 small pressure wounds on the dorsum of her foot with a small amount of serous drainage.  MEDICAL ISSUES: I would agree with continuing her antibiotics for now.. The VAC and begin wet-to-dry dressing changes to her open TMA site. She had 3 remaining sutures on the medial aspect of the TMA which are removed. She has quit smoking since her surgery. I'll see her back in 3 weeks. She knows to call sooner if she has problems. In addition I emphasized the importance of elevating her leg because of the swelling in her left leg.  Hatem Cull S Vascular and Vein Specialists of [place] [facility]: [phone]

## 2011-05-31 ENCOUNTER — Telehealth: Payer: Self-pay | Admitting: *Deleted

## 2011-05-31 NOTE — Telephone Encounter (Signed)
Called patients home number to find out the [facility] caring for her wound vac. Dr Darrick Penna had ordered vac to be D/C 05/30/11. Patient was not in,however her mother understood that I need to speak to pt.

## 2011-06-18 ENCOUNTER — Encounter: Payer: Self-pay | Admitting: Vascular Surgery

## 2011-06-19 ENCOUNTER — Ambulatory Visit (INDEPENDENT_AMBULATORY_CARE_PROVIDER_SITE_OTHER): Payer: PRIVATE HEALTH INSURANCE | Admitting: Vascular Surgery

## 2011-06-19 ENCOUNTER — Encounter: Payer: Self-pay | Admitting: Vascular Surgery

## 2011-06-19 VITALS — BP 159/72 | HR 70 | Resp 16 | Ht 68.0 in | Wt 250.0 lb

## 2011-06-19 DIAGNOSIS — T8189XA Other complications of procedures, not elsewhere classified, initial encounter: Secondary | ICD-10-CM

## 2011-06-19 DIAGNOSIS — I70219 Atherosclerosis of native arteries of extremities with intermittent claudication, unspecified extremity: Secondary | ICD-10-CM

## 2011-06-19 NOTE — Progress Notes (Signed)
Vascular and Vein Specialist of [facility]  Patient name: Pamela [name] MRN: [id number] DOB: 1950-10-27 Sex: female  REASON FOR VISIT: follow up of left femoropopliteal bypass graft and open transmetatarsal amputation left foot.  HPI: Pamela [name] is a 61 y.o. female who would presented with an extensive diabetic foot infection on the left. I performed a left femoral to above-knee popliteal artery bypass with a vein graft on 04/04/2011 an open transmetatarsal amputation. When I saw her last on 05/29/2011 the amputation site measured 4 cm in width by 12 cm in length. She is still at a skilled nursing facility. She's been having dressing changes done there. Said no significant pain in the foot. Swelling in her left leg has improved.  Past Medical History  Diagnosis Date  . Diabetes mellitus   . GERD (gastroesophageal reflux disease)   . Gout   . Hypertension   . High cholesterol   . Arthritis   . COPD (chronic obstructive pulmonary disease)    SOCIAL HISTORY: History  Substance Use Topics  . Smoking status: Former Smoker -- 0.0 packs/day for 45 years    Types: Cigarettes    Quit date: 04/12/2011  . Smokeless tobacco: Not on file  . Alcohol Use: No   REVIEW OF SYSTEMS: Arly.Keller ] denotes positive finding; [  ] denotes negative finding  CARDIOVASCULAR:  [ ]  chest pain   [ ]  chest pressure   [ ]  palpitations   [ ]  orthopnea  [CONSTITUTIONAL:  [ ]  fever   [ ]  chills  PHYSICAL EXAM: Filed Vitals:   06/19/11 1431  BP: 159/72  Pulse: 70  Resp: 16  Height: 5\' 8"  (1.727 m)  Weight: 250 lb (113.399 kg)  SpO2: 91%   Body mass index is 38.01 kg/(m^2). GENERAL: The patient is a well-nourished female, in no acute distress. The vital signs are documented above. CARDIOVASCULAR: There is a regular rate and rhythm She has moderate swelling of the left lower extremity. Difficult to palpate her popliteal pulse because of her size. She does have a brisk biphasic dorsalis pedis signal on the left  with the Doppler. Foot is warm and well-perfused. The wound on the left foot measures 3 cm in width by 12 cm in length and has improved compared to her last visit. It is granulating nicely. There is some fiber along the medial aspect of the wound I think wet-to-dry dressing changes will take care of this.  MEDICAL ISSUES: Her bypass graft appears to be patent. The wound is improving. To continue dressing changes. I'll see her back in 6 weeks. She knows to call sooner she has problems.  Pamela [name] S Vascular and Vein Specialists of [place] [facility]: [phone]

## 2011-07-30 ENCOUNTER — Encounter: Payer: Self-pay | Admitting: Vascular Surgery

## 2011-07-31 ENCOUNTER — Encounter: Payer: Self-pay | Admitting: Vascular Surgery

## 2011-07-31 ENCOUNTER — Ambulatory Visit (INDEPENDENT_AMBULATORY_CARE_PROVIDER_SITE_OTHER): Payer: PRIVATE HEALTH INSURANCE | Admitting: Vascular Surgery

## 2011-07-31 VITALS — BP 167/75 | HR 83 | Temp 98.6°F | Ht 68.0 in | Wt 232.0 lb

## 2011-07-31 DIAGNOSIS — I70219 Atherosclerosis of native arteries of extremities with intermittent claudication, unspecified extremity: Secondary | ICD-10-CM

## 2011-07-31 NOTE — Progress Notes (Signed)
Vascular and Vein Specialist of [facility]  Patient name: Pamela [name] MRN: [id number] DOB: [birth year] Sex: female  REASON FOR VISIT: follow up of left transmetatarsal amputation  HPI: Pamela [name] is a 61 y.o. female who presented with an extensive diabetic foot infection on the left. She underwent a left femoral to above-knee popliteal artery bypass with a vein graft and transmetatarsal amputation the left foot on 04/04/2011. I been following the transmetatarsal amputation site. She comes in for a routine wound check. She's had no significant pain in the foot. She has been gradually increasing her activities.   REVIEW OF SYSTEMS: Arly.Keller ] denotes positive finding; [  ] denotes negative finding  CARDIOVASCULAR:  [ ]  chest pain   [ ]  dyspnea on exertion    CONSTITUTIONAL:  [ ]  fever   [ ]  chills  PHYSICAL EXAM: Filed Vitals:   07/31/11 1317  BP: 167/75  Pulse: 83  Temp: 98.6 F (37 C)  TempSrc: Oral  Height: 5\' 8"  (1.727 m)  Weight: 232 lb (105.235 kg)  SpO2: 98%   Body mass index is 35.28 kg/(m^2). GENERAL: The patient is a well-nourished female, in no acute distress. The vital signs are documented above. CARDIOVASCULAR: There is a regular rate and rhythm  PULMONARY: There is good air exchange bilaterally without wheezing or rales. She has a palpable dorsalis pedis pulse in the left foot. Transmetatarsal amputation site is almost completely healed. There is an area about a half a centimeter in width and 3 cm in length which is still granulating.  MEDICAL ISSUES: At this point as the daughter to simply at apply bacitracin to the area of his open and covered with a Band-Aid. I think this will continue to heal without any problems. I plan on seeing her back in 6 months with follow up ABIs and we'll put her graft on our protocol. She knows to call sooner if she has problems.  Pamela [name] S Vascular and Vein Specialists of [place] [facility]: [phone]

## 2012-02-04 ENCOUNTER — Encounter: Payer: Self-pay | Admitting: Neurosurgery

## 2012-02-05 ENCOUNTER — Encounter: Payer: Self-pay | Admitting: Neurosurgery

## 2012-02-05 ENCOUNTER — Encounter (INDEPENDENT_AMBULATORY_CARE_PROVIDER_SITE_OTHER): Payer: Medicaid - Out of State | Admitting: *Deleted

## 2012-02-05 ENCOUNTER — Ambulatory Visit (INDEPENDENT_AMBULATORY_CARE_PROVIDER_SITE_OTHER): Payer: Medicaid - Out of State | Admitting: Neurosurgery

## 2012-02-05 ENCOUNTER — Encounter (INDEPENDENT_AMBULATORY_CARE_PROVIDER_SITE_OTHER): Payer: PRIVATE HEALTH INSURANCE | Admitting: *Deleted

## 2012-02-05 VITALS — BP 147/90 | HR 97 | Resp 18 | Ht 66.0 in | Wt 233.0 lb

## 2012-02-05 DIAGNOSIS — Z48812 Encounter for surgical aftercare following surgery on the circulatory system: Secondary | ICD-10-CM

## 2012-02-05 DIAGNOSIS — I70219 Atherosclerosis of native arteries of extremities with intermittent claudication, unspecified extremity: Secondary | ICD-10-CM

## 2012-02-05 DIAGNOSIS — I739 Peripheral vascular disease, unspecified: Secondary | ICD-10-CM

## 2012-02-05 NOTE — Progress Notes (Signed)
VASCULAR & VEIN SPECIALISTS OF [place] PAD/PVD Office Note  CC: PVD surveillance Referring Physician: Edilia Bo  History of Present Illness: 61 year old female patient of Dr. Edilia Bo who is status post a left femoropopliteal bypass graft in January 2013. The patient denies claudication, rest pain or open ulcerations. The patient states she has no trouble performing her ADLs.  Past Medical History  Diagnosis Date  . Diabetes mellitus   . GERD (gastroesophageal reflux disease)   . Gout   . Hypertension   . High cholesterol   . Arthritis   . COPD (chronic obstructive pulmonary disease)     ROS: [x]  Positive   [ ]  Denies    General: [ ]  Weight loss, [ ]  Fever, [ ]  chills Neurologic: [ ]  Dizziness, [ ]  Blackouts, [ ]  Seizure [ ]  Stroke, [ ]  "Mini stroke", [ ]  Slurred speech, [ ]  Temporary blindness; [ ]  weakness in arms or legs, [ ]  Hoarseness Cardiac: [ ]  Chest pain/pressure, [ ]  Shortness of breath at rest [ ]  Shortness of breath with exertion, [ ]  Atrial fibrillation or irregular heartbeat Vascular: [ ]  Pain in legs with walking, [ ]  Pain in legs at rest, [ ]  Pain in legs at night,  [ ]  Non-healing ulcer, [ ]  Blood clot in vein/DVT,   Pulmonary: [ ]  Home oxygen, [ ]  Productive cough, [ ]  Coughing up blood, [ ]  Asthma,  [ ]  Wheezing Musculoskeletal:  [ ]  Arthritis, [ ]  Low back pain, [ ]  Joint pain Hematologic: [ ]  Easy Bruising, [ ]  Anemia; [ ]  Hepatitis Gastrointestinal: [ ]  Blood in stool, [ ]  Gastroesophageal Reflux/heartburn, [ ]  Trouble swallowing Urinary: [ ]  chronic Kidney disease, [ ]  on HD - [ ]  MWF or [ ]  TTHS, [ ]  Burning with urination, [ ]  Difficulty urinating Skin: [ ]  Rashes, [ ]  Wounds Psychological: [ ]  Anxiety, [ ]  Depression   Social History History  Substance Use Topics  . Smoking status: Former Smoker -- 0.0 packs/day for 45 years    Types: Cigarettes    Quit date: 04/12/2011  . Smokeless tobacco: Not on file  . Alcohol Use: No    Family  History Family History  Problem Relation Age of Onset  . Hyperlipidemia Father   . Heart disease Father     Heart Disease before age 32  . Heart attack Father   . Heart disease Brother     Heart Disease before age 50  . Hyperlipidemia Brother   . Hypertension Brother   . Stroke Brother     Allergies  Allergen Reactions  . Azo (Phenazopyridine Hcl) Itching, Swelling and Rash  . Ciprofloxacin     According to MD notes pt has allergy to Cipro  . Codeine Itching and Rash    Current Outpatient Prescriptions  Medication Sig Dispense Refill  . allopurinol (ZYLOPRIM) 300 MG tablet Take 300 mg by mouth daily.        Marland Kitchen aspirin EC 81 MG EC tablet Take 1 tablet (81 mg total) by mouth daily.      Marland Kitchen atorvastatin (LIPITOR) 10 MG tablet Take 10 mg by mouth daily.        . B Complex-C-Min-Fe-FA (HEMATINIC PLUS VIT/MINERALS) TABS       . B-D ULTRAFINE III SHORT PEN 31G X 8 MM MISC       . BAYER BREEZE 2 TEST DISK       . BAYER MICROLET LANCETS lancets       . BD  INSULIN SYRINGE ULTRAFINE 31G X 5/16" 0.3 ML MISC       . Cyanocobalamin (VITAMIN B-12 PO) Take 1 tablet by mouth daily.        . cyclobenzaprine (FLEXERIL) 10 MG tablet Take 10 mg by mouth 3 (three) times daily as needed.      . ergocalciferol (VITAMIN D2) 50000 UNITS capsule Take 50,000 Units by mouth once a week.       . ferrous sulfate 325 (65 FE) MG tablet Take 1 tablet (325 mg total) by mouth 2 (two) times daily with a meal.      . hydrOXYzine (ATARAX/VISTARIL) 25 MG tablet       . insulin glargine (LANTUS) 100 UNIT/ML injection Inject 26 Units into the skin at bedtime.  10 mL    . insulin glargine (LANTUS) 100 UNIT/ML injection Inject 30 Units into the skin daily.  10 mL    . losartan (COZAAR) 100 MG tablet       . omega-3 acid ethyl esters (LOVAZA) 1 G capsule Take 4 g by mouth daily.        Marland Kitchen oxyCODONE-acetaminophen (PERCOCET) 5-325 MG per tablet Take 1 tablet by mouth every 4 (four) hours as needed.      Marland Kitchen amLODipine  (NORVASC) 10 MG tablet Take 10 mg by mouth daily.        Marland Kitchen gabapentin (NEURONTIN) 800 MG tablet Take 800 mg by mouth 3 (three) times daily.        . insulin aspart (NOVOLOG) 100 UNIT/ML injection Inject 0-15 Units into the skin 3 (three) times daily with meals.  1 vial    . insulin aspart (NOVOLOG) 100 UNIT/ML injection Inject 0-5 Units into the skin at bedtime.  1 vial    . metoprolol tartrate (LOPRESSOR) 25 MG tablet Take 1 tablet (25 mg total) by mouth 2 (two) times daily.      Marland Kitchen sulfamethoxazole-trimethoprim (BACTRIM DS) 800-160 MG per tablet Take 1 tablet by mouth 2 (two) times daily.      Marland Kitchen zolpidem (AMBIEN) 5 MG tablet Take 1 tablet (5 mg total) by mouth at bedtime as needed for sleep (insomnia).  30 tablet  0    Physical Examination  Filed Vitals:   02/05/12 1511  BP: 147/90  Pulse: 97  Resp: 18    Body mass index is 37.61 kg/(m^2).  General:  WDWN in NAD Gait: Normal HEENT: WNL Eyes: Pupils equal Pulmonary: normal non-labored breathing , without Rales, rhonchi,  wheezing Cardiac: RRR, without  Murmurs, rubs or gallops; No carotid bruits Abdomen: soft, NT, no masses Skin: no rashes, ulcers noted Vascular Exam/Pulses: Palpable PT and DP pulses bilaterally  Extremities without ischemic changes, no Gangrene , no cellulitis; no open wounds;  Musculoskeletal: no muscle wasting or atrophy  Neurologic: A&O X 3; Appropriate Affect ; SENSATION: normal; MOTOR FUNCTION:  moving all extremities equally. Speech is fluent/normal  Non-Invasive Vascular Imaging: ABIs today are 0.99 on the right, 0.91 and biphasic to triphasic on the left with a patent left femoropopliteal bypass, the ABIs are slightly improved compared to previous exam  ASSESSMENT/PLAN: The patient will followup in one year with repeat ABIs and graft duplex. The patient knows to call the office and she has difficulties in the interim. The patient's questions were encouraged and answered, she is in agreement with this  plan.  Lauree Chandler ANP  Clinic M.D.: Edilia Bo

## 2012-02-06 NOTE — Addendum Note (Signed)
Addended by: Sharee Pimple on: 02/06/2012 09:32 AM   Modules accepted: Orders

## 2012-02-06 NOTE — Addendum Note (Signed)
Addended by: Sharee Pimple on: 02/06/2012 09:30 AM   Modules accepted: Orders

## 2012-04-17 IMAGING — CR DG ANG/EXT/UNI/OR LEFT
1 series · 1 of 1 positions shown · non-contrast
Comparison: None.

CLINICAL DATA: Left lower extremity bypass grafting.

LATIA THAKOR/EXT/UNI/ OR

[view not recorded]
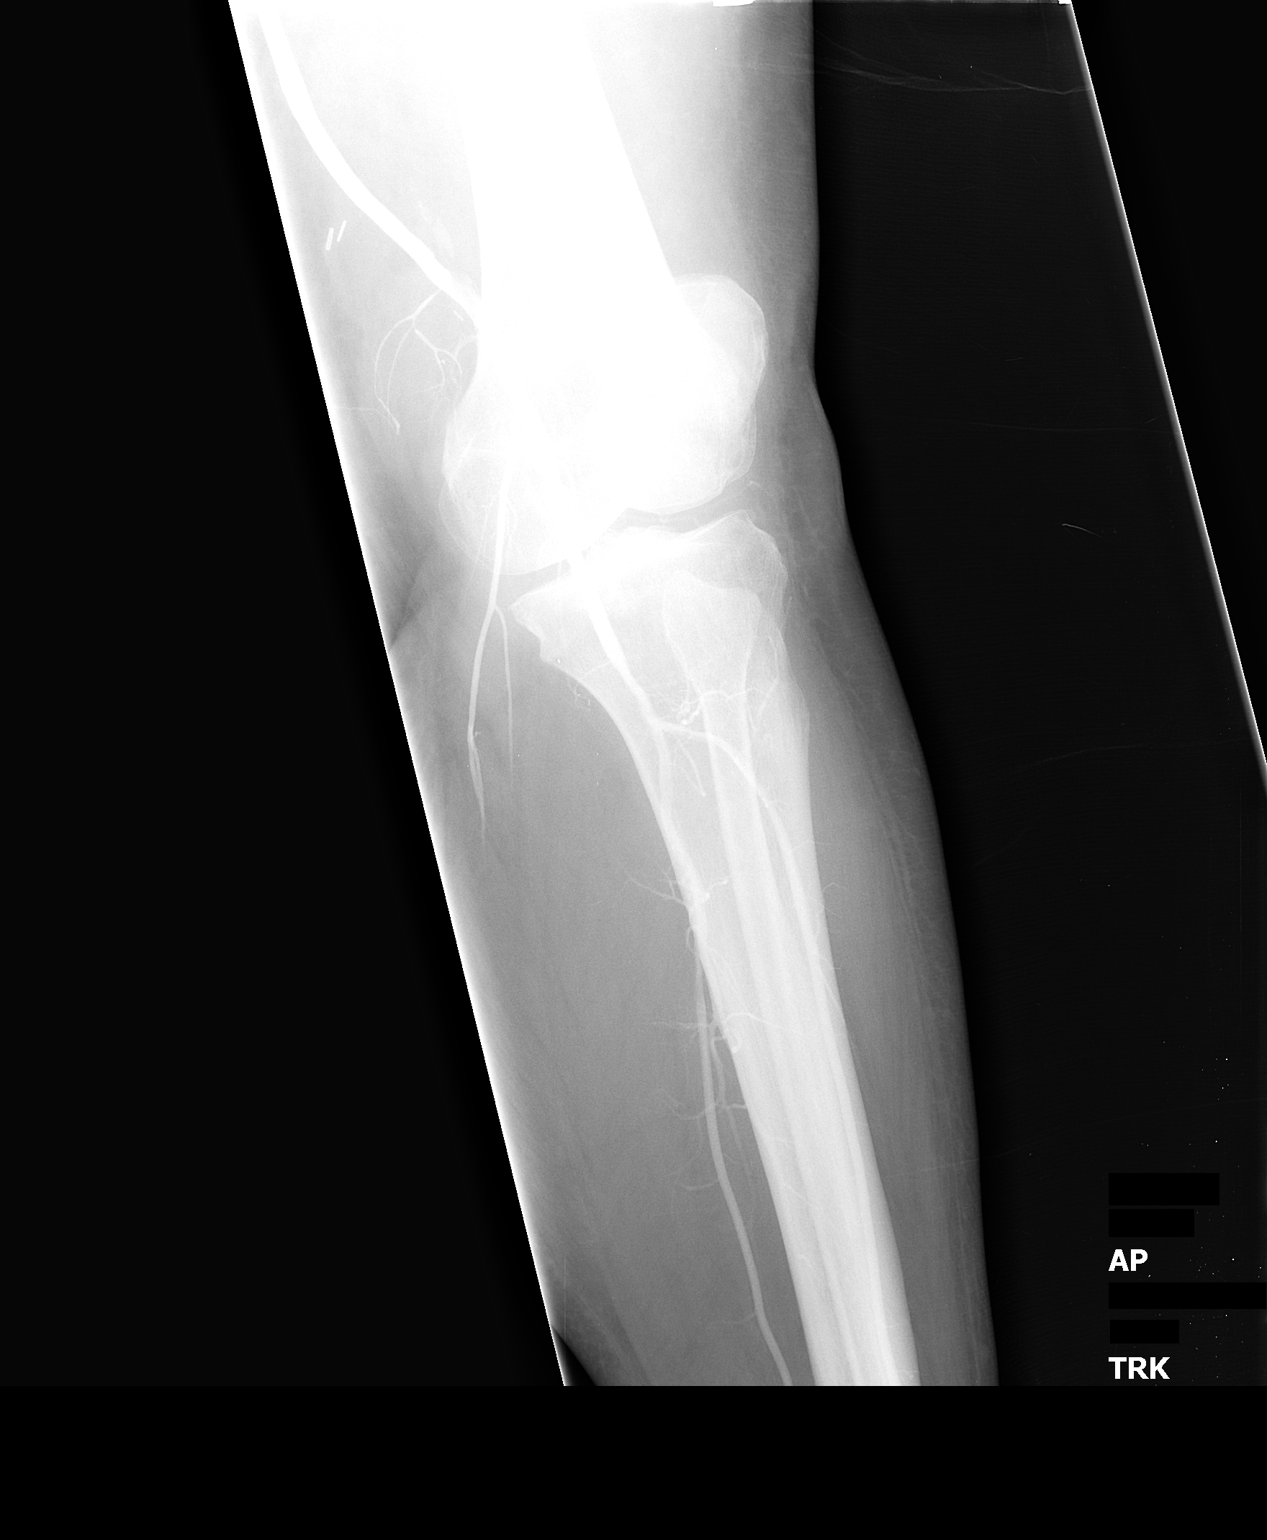

[1 of 1 positions shown; findings below may reference images not displayed]

FINDINGS: Intraoperative image shows distal end of a bypass graft
with anastomosis to the popliteal artery above the knee.  There is
some irregularity at the level of the distal anastomosis.
Visualized runoff in the proximal to mid calf shows normally
opacified tibial vessels.
IMPRESSION: Bypass graft to the left popliteal artery.  Anastomosis to the
popliteal artery above the knee is mildly irregular.

## 2012-06-16 ENCOUNTER — Encounter: Payer: Self-pay | Admitting: Vascular Surgery

## 2012-06-17 ENCOUNTER — Ambulatory Visit (INDEPENDENT_AMBULATORY_CARE_PROVIDER_SITE_OTHER): Payer: PRIVATE HEALTH INSURANCE | Admitting: Vascular Surgery

## 2012-06-17 ENCOUNTER — Encounter: Payer: Self-pay | Admitting: Vascular Surgery

## 2012-06-17 VITALS — BP 147/63 | HR 81 | Ht 66.0 in | Wt 241.0 lb

## 2012-06-17 DIAGNOSIS — I739 Peripheral vascular disease, unspecified: Secondary | ICD-10-CM

## 2012-06-17 DIAGNOSIS — L98499 Non-pressure chronic ulcer of skin of other sites with unspecified severity: Secondary | ICD-10-CM

## 2012-06-17 DIAGNOSIS — I7025 Atherosclerosis of native arteries of other extremities with ulceration: Secondary | ICD-10-CM | POA: Insufficient documentation

## 2012-06-17 NOTE — Assessment & Plan Note (Signed)
This patient has some mild erythema of her medial right heel but no open ulcer. She is asymptomatic. She denies fever or chills. She has brisk Doppler signals in the dorsalis pedis and posterior tibial positions on the right and I think as adequate circulation here. At this point I would not recommend arteriography because I would not recommend revascularization unless the wound or some. She will keep a close eye on this. If there is any change or let us know in which case we would need to proceed with arteriography. However at this point I think they caught this early enough that this will resolve on its own. Fortunately she is not a smoker.

## 2012-06-17 NOTE — Progress Notes (Addendum)
Vascular and Vein Specialist of [facility]  Patient name: Pamela [name] MRN: [id number] DOB: [birth year] Sex: female  REASON FOR VISIT: redness on right heel.  HPI: Pamela [name] is a 62 y.o. female who has undergone a previous left femoropopliteal bypass graft and left transmetatarsal amputation for extensive wound of the left foot. She was seen in November of 2013 at which time her graft was noted to be patent and she had ABIs that were essentially normal on both sides. She was set up for follow up visit in one year. She suddenly developed some redness in the medial aspect of her right heel and it was felt that she might be developing an early heel wound. She had been in the hospital in [facility] after having a seizure. She denies any fever or chills. She denies any claudication or rest pain. She denies any nonhealing ulcers on the right foot.  REVIEW OF SYSTEMS: Arly.Keller ] denotes positive finding; [  ] denotes negative finding  CARDIOVASCULAR:  [ ]  chest pain   [ ]  dyspnea on exertion    CONSTITUTIONAL:  [ ]  fever   [ ]  chills  PHYSICAL EXAM: Filed Vitals:   06/17/12 0916  BP: 147/63  Pulse: 81  Height: 5\' 6"  (1.676 m)  Weight: 241 lb (109.317 kg)  SpO2: 97%   Body mass index is 38.92 kg/(m^2). GENERAL: The patient is a well-nourished female, in no acute distress. The vital signs are documented above. CARDIOVASCULAR: There is a regular rate and rhythm. She has palpable femoral pulses. PULMONARY: There is good air exchange bilaterally without wheezing or rales. She has some mild erythema of her medial right heel but no fluctuance to suggest an abscess. There is no open ulcer.  She has a brisk monophasic signals in the right dorsalis pedis and posterior tibial positions. She has a biphasic dorsalis pedis signal on the left.  Her Doppler study in November of 2013 showed an ABI of 95% on the left with a patent femoropopliteal bypass in an ABI of 60% on the right suggesting some  superficial femoral artery occlusive disease.  MEDICAL ISSUES:  PAD (peripheral artery disease) This patient has some mild erythema of her medial right heel but no open ulcer. She is asymptomatic. She denies fever or chills. She has brisk Doppler signals in the dorsalis pedis and posterior tibial positions on the right and I think as adequate circulation here. At this point I would not recommend arteriography because I would not recommend revascularization unless the wound or some. She will keep a close eye on this. If there is any change or let us know in which case we would need to proceed with arteriography. However at this point I think they caught this early enough that this will resolve on its own. Fortunately she is not a smoker.   DICKSON,CHRISTOPHER S [facility] Vein Specialists of [place] [facility]: [phone]

## 2013-02-09 ENCOUNTER — Encounter: Payer: Self-pay | Admitting: Family

## 2013-02-10 ENCOUNTER — Other Ambulatory Visit: Payer: Self-pay | Admitting: Neurosurgery

## 2013-02-10 ENCOUNTER — Ambulatory Visit (INDEPENDENT_AMBULATORY_CARE_PROVIDER_SITE_OTHER): Payer: Medicaid - Out of State | Admitting: Family

## 2013-02-10 ENCOUNTER — Encounter (INDEPENDENT_AMBULATORY_CARE_PROVIDER_SITE_OTHER): Payer: Self-pay

## 2013-02-10 ENCOUNTER — Ambulatory Visit (INDEPENDENT_AMBULATORY_CARE_PROVIDER_SITE_OTHER)
Admission: RE | Admit: 2013-02-10 | Discharge: 2013-02-10 | Disposition: A | Discharge status: Home / Self Care | Payer: Medicaid - Out of State | Source: Ambulatory Visit | Attending: Neurosurgery | Admitting: Neurosurgery

## 2013-02-10 ENCOUNTER — Ambulatory Visit (HOSPITAL_COMMUNITY)
Admission: RE | Admit: 2013-02-10 | Discharge: 2013-02-10 | Disposition: A | Discharge status: Home / Self Care | Payer: Medicaid - Out of State | Source: Ambulatory Visit | Attending: Family | Admitting: Family

## 2013-02-10 ENCOUNTER — Encounter: Payer: Self-pay | Admitting: Family

## 2013-02-10 VITALS — BP 146/71 | HR 75 | Resp 16 | Ht 68.0 in | Wt 220.0 lb

## 2013-02-10 DIAGNOSIS — I70219 Atherosclerosis of native arteries of extremities with intermittent claudication, unspecified extremity: Secondary | ICD-10-CM

## 2013-02-10 DIAGNOSIS — Z48812 Encounter for surgical aftercare following surgery on the circulatory system: Secondary | ICD-10-CM

## 2013-02-10 DIAGNOSIS — I739 Peripheral vascular disease, unspecified: Secondary | ICD-10-CM

## 2013-02-10 DIAGNOSIS — M79609 Pain in unspecified limb: Secondary | ICD-10-CM

## 2013-02-10 DIAGNOSIS — M7989 Other specified soft tissue disorders: Secondary | ICD-10-CM

## 2013-02-10 DIAGNOSIS — R200 Anesthesia of skin: Secondary | ICD-10-CM

## 2013-02-10 DIAGNOSIS — R209 Unspecified disturbances of skin sensation: Secondary | ICD-10-CM

## 2013-02-10 DIAGNOSIS — R29898 Other symptoms and signs involving the musculoskeletal system: Secondary | ICD-10-CM

## 2013-02-10 NOTE — Progress Notes (Signed)
[facility] [place] HISTORY AND PHYSICAL -PAD  History of Present Illness Pamela [name] is a 62 y.o. female patient of Dr. Edilia Bo who is status post left femoropopliteal bypass graft and left transmetatarsal amputation for extensive wound of the left foot on 04/04/11. Right heel ulcer healed about a week ago, no other non-healing wounds. Could not take physical therapy until the heel ulcer healed. Bilateral legs swelling started when she became wheelchair bound since she could not walk due to pain in legs. Tightening in right lateral upper thigh when she stands or walks. Stroke this Summer, was intubated. Stroke was manifested as confusion when taking her medications, denies MI or infection at this time. No lateralizing symptoms at this time, just generalized weakness. Has some residual mild memory issues.  Patient denies New Medical or Surgical History other than the above.   Pt Diabetic: Yes, not in very good control, per pt., but states she is working on it and is getting better. Pt smoker: former smoker, quit 2-3 years ago  Pt meds include: Statin :No, states all statins caused chest pain ASA: No Other anticoagulants/antiplatelets: no, does not want to take as she states she relates this to her brother hemorrhaged while on anticoagulants and died.  Past Medical History  Diagnosis Date  . Diabetes mellitus   . GERD (gastroesophageal reflux disease)   . Gout   . Hypertension   . High cholesterol   . Arthritis   . COPD (chronic obstructive pulmonary disease)   . Peripheral arterial disease     Social History History  Substance Use Topics  . Smoking status: Former Smoker -- 0.00 packs/day for 45 years    Types: Cigarettes    Quit date: 04/12/2011  . Smokeless tobacco: Never Used  . Alcohol Use: No    Family History Family History  Problem Relation Age of Onset  . Hyperlipidemia Father   . Heart disease Father     Heart Disease before age 11  .  Heart attack Father   . Cancer Father     Throat  . Heart disease Brother     Heart Disease before age 54  . Hyperlipidemia Brother   . Hypertension Brother   . Stroke Brother   . Heart attack Brother   . Hypertension Mother   . COPD Brother   . Hyperlipidemia Brother   . Hypertension Brother   . COPD Brother   . Hyperlipidemia Brother   . Hypertension Brother     Past Surgical History  Procedure Laterality Date  . Cesarean section  04/11/1986  . Femoral-popliteal bypass graft  04/04/2011    Procedure: BYPASS GRAFT FEMORAL-POPLITEAL ARTERY;  Surgeon: Chuck Hint, MD;  Location: [facility] OR;  Service: Vascular;  Laterality: Left;  left femoral-above knee popliteal bypass graft using left  non reverse greater saphenous vein  . Amputation  04/04/2011    Procedure: AMPUTATION DIGIT;  Surgeon: Chuck Hint, MD;  Location: [facility] OR;  Service: Vascular;  Laterality: Left;   left forefoot amputation  . Eye surgery      Bilateral cataract    Allergies  Allergen Reactions  . Azo [Phenazopyridine Hcl] Itching, Swelling and Rash  . Ciprofloxacin     According to MD notes pt has allergy to Cipro  . Codeine Itching and Rash    Current Outpatient Prescriptions  Medication Sig Dispense Refill  . allopurinol (ZYLOPRIM) 300 MG tablet Take 300 mg by mouth daily.        Marland Kitchen  amLODipine (NORVASC) 10 MG tablet Take 10 mg by mouth daily.        . B Complex-C-Min-Fe-FA (HEMATINIC PLUS VIT/MINERALS) TABS       . B-D ULTRAFINE III SHORT PEN 31G X 8 MM MISC       . BAYER BREEZE 2 TEST DISK       . BAYER MICROLET LANCETS lancets       . BD INSULIN SYRINGE ULTRAFINE 31G X 5/16" 0.3 ML MISC       . Cyanocobalamin (VITAMIN B-12 PO) Take 1 tablet by mouth daily.        . ergocalciferol (VITAMIN D2) 50000 UNITS capsule Take 50,000 Units by mouth once a week.       . gabapentin (NEURONTIN) 800 MG tablet Take 800 mg by mouth 3 (three) times daily.        . hydrOXYzine (ATARAX/VISTARIL) 25 MG tablet        . losartan (COZAAR) 100 MG tablet       . omega-3 acid ethyl esters (LOVAZA) 1 G capsule Take 4 g by mouth daily.        Marland Kitchen oxyCODONE-acetaminophen (PERCOCET) 5-325 MG per tablet Take 1 tablet by mouth every 4 (four) hours as needed.      . silver sulfADIAZINE (SILVADENE) 1 % cream continuous as needed.      . VENTOLIN HFA 108 (90 BASE) MCG/ACT inhaler       . amoxicillin (AMOXIL) 500 MG capsule Take 1 capsule by mouth 2 (two) times daily.      Marland Kitchen atorvastatin (LIPITOR) 10 MG tablet Take 10 mg by mouth daily.        . cyclobenzaprine (FLEXERIL) 10 MG tablet Take 10 mg by mouth 3 (three) times daily as needed.      . diazepam (VALIUM) 5 MG tablet Take 1 tablet by mouth every 6 (six) hours.      . ferrous sulfate 325 (65 FE) MG tablet Take 1 tablet (325 mg total) by mouth 2 (two) times daily with a meal.      . insulin aspart (NOVOLOG) 100 UNIT/ML injection Inject 0-15 Units into the skin 3 (three) times daily with meals.  1 vial    . insulin aspart (NOVOLOG) 100 UNIT/ML injection Inject 0-5 Units into the skin at bedtime.  1 vial    . insulin glargine (LANTUS) 100 UNIT/ML injection Inject 26 Units into the skin at bedtime.  10 mL    . insulin glargine (LANTUS) 100 UNIT/ML injection Inject 30 Units into the skin daily.  10 mL    . metoprolol tartrate (LOPRESSOR) 25 MG tablet Take 1 tablet (25 mg total) by mouth 2 (two) times daily.      Marland Kitchen sulfamethoxazole-trimethoprim (BACTRIM DS) 800-160 MG per tablet Take 1 tablet by mouth 2 (two) times daily.      Marland Kitchen zolpidem (AMBIEN) 5 MG tablet Take 1 tablet (5 mg total) by mouth at bedtime as needed for sleep (insomnia).  30 tablet  0   No current facility-administered medications for this visit.    ROS: [x]  Positive   [ ]  Denies  General:[ ]  Weight loss,  [ ]  Weight gain, [ ]  Fever, [ ]  chills Neurologic: [ ]  Dizziness, [ ]  Blackouts, [ ]  Seizure [ ]  Stroke, [ ]  "Mini stroke", [ ]  Slurred speech, [ ]  Temporary blindness;  [ ] weakness, [ ]   Hoarseness Cardiac: [ ]  Chest pain/pressure, [ ]  Shortness of breath at rest [ ]  Shortness of  breath with exertion,  [ ]   Atrial fibrillation or irregular heartbeat Vascular:[ ]  Pain in legs with walking, [ ]  Pain in legs at rest ,[ ]  Pain in legs at night,  [ ]   Non-healing ulcer, [ ]  Blood clot in vein/DVT,   Pulmonary: [ ]  Home oxygen, [ ]   Productive cough, [ ]  Coughing up blood,  [ ]  Asthma,  [ ]  Wheezing Musculoskeletal:  [ ]  Arthritis, [ ]  Low back pain,  [ ]  Joint pain Hematologic:[ ]  Easy Bruising, [ ]  Anemia; [ ]  Hepatitis Gastrointestinal: [ ]  Blood in stool,  [ ]  Gastroesophageal Reflux, [ ]  Trouble swallowing Urinary: [ ]  chronic Kidney disease, [ ]  on HD, [ ]  Burning with urination, [ ]  Frequent urination, [ ]  Difficulty urinating;  Skin: [ ]  Rashes, [ ]  Wounds     Physical Examination  Filed Vitals:   02/10/13 1629  BP: 146/71  Pulse: 75  Resp: 16   Filed Weights   02/10/13 1629  Weight: 220 lb (99.791 kg)   Body mass index is 33.46 kg/(m^2).  General: A&O x 3, WDWN, obese. Gait: in wheelchair from home Eyes: PERRLA, Pulmonary: CTAB, without wheezes , rales or rhonchi Cardiac: regular Rythm , without murmur          Carotid Bruits Left Right   Negative Negative  Radial pulses: are 2+ and =                           VASCULAR EXAM: Extremities without ischemic changes : evidence of right heel ulcer which has healed, no dependent rubor, no cyanosis. Left foot stump is well healed without Gangrene; without open wounds. Both LE's with 2+ pretibial pitting edema.                                                                                                          LE Pulses LEFT RIGHT       POPLITEAL  not palpable   not palpable       POSTERIOR TIBIAL  biphasic by Doppler and not palpable   biphasic by Doppler and not palpable        DORSALIS PEDIS      ANTERIOR TIBIAL biphasic by Doppler and not palpable   biphasic by Doppler and not palpable         PERONEAL monophasic by Doppler and not Palpable   monophasic by Doppler and not Palpable    Abdomen: soft, NT, no masses. Skin: no rashes, no ulcers noted. Musculoskeletal: no muscle wasting or atrophy.  Neurologic: A&O X 3; Appropriate Affect ; SENSATION: normal; MOTOR FUNCTION:  moving all extremities equally, motor strength 3/5 throughout. Speech is fluent/normal. CN 2-12 intact.    Non-Invasive Vascular Imaging: DATE: 02/10/2013 ABI: RIGHT 0.56; biphasic by hand-held Doppler  LEFT 0.93, Waveforms: triphasic  DUPLEX SCAN OF BYPASS: Patent left femoral to popliteal artery BPG. Elevated velocities present at the left common femoral artery suggestive of greater than 50% stenosis. Patent left arterial outflow to the  level of the tibioperoneal trunk.   ASSESSMENT: Pamela [name] is a 62 y.o. female who is status post left femoropopliteal bypass graft and left transmetatarsal amputation for extensive wound of the left foot on 04/04/11. 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 Triphasic waveforms in left ankle, biphasic waveforms in right foot by hand-held Doppler with no ischemic changes in either foot.  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 She was advised to elevate legs when sitting.  Chair exercises discussed, states she will do them daily, will start physical therapy soon. In addition to inactivity, her other risk factors for atherosclerosis progression are dyslipidemia and she cannot tolerate statins, obesity, uncontrolled DM.  PLAN:  I discussed in depth with the patient the nature of atherosclerosis, and emphasized the importance of maximal medical management including strict control of blood pressure, blood glucose, and lipid levels, obtaining regular exercise, and continued  cessation of smoking.  The patient is aware that without maximal medical management the underlying atherosclerotic disease process will progress, limiting the benefit of any interventions. Based on the patient's vascular studies and examination, pt will return to clinic in 6  months for  ABI's and bilat. LE arterial Duplex. Since she said she had a stroke while hospitalized this Summer, will also obtain carotid Duplex in 6 months; those hospital records are not in EPIC.   The patient was given information about PAD including signs, symptoms, treatment, what symptoms should prompt the patient to seek immediate medical care, and risk reduction measures to take.  Charisse March, RN, MSN, FNP-C [facility] [place] Phone: [phone]  Clinic MD: Edilia Bo  02/10/2013 4:43 PM   VASCULAR QUALITY INITIATIVE FOLLOW UP DATA:  Current smoker: [  ] yes  [ X ] no  Living status: [ x ]  Home  [  ] Nursing home  [  ] Homeless    MEDS:  ASA [  ] yes  Arly.Keller ] no- [ X ] medical reason  [  ] non compliant  STATIN  [  ] yes  [ X ] no- [ X ] medical reason  [  ] non compliant  Beta blocker [ X ] yes  [  ] no- [  ] medical reason  [  ] non compliant  ACE inhibitor [  ] yes  Arly.Keller  ] no- Arly.Keller  ] medical reason  [  ] non compliant  P2Y12 Antagonist [  ] none  [  ] clopidogrel-Plavix  [  ] ticlopidine-Ticlid   [  ] prasugrel-Effient  [  ] ticagrelor- Brilinta    Anticoagulant [ X ] None  [  ] warfarin  [  ] rivaroxaban-Xarelto [  ] dabigatran- Pradaxa  Ambulation: [  ] Amb  [ X ] Amb with assistance  [  ] wheelchair  [  ] bedridden  Ipsilateral Sx: Arly.Keller  ] none   [  ] claudication  [  ] rest pain  [  ] tissue loss  Current Patency: Arly.Keller  ] primary  [  ] primary-assisted  [  ] secondary  [  ] occluded  Patency judged by: [ X ] doppler  [  ] palp graft pulse  [  ] palp distal pulse   [  ]  ABI increase > 15%   [  ] Duplex  If occluded, when-   Ipsilateral ABI: 0.93  Ipsilateral  TBI: metatarsal amputation  Infection: [x  ] none  [  ] cellulitis  [  ] deep abscess  [  ] infection of artery or graft  Bypass revision: [x  ] no  [  ] yes- [  ] surg  [  ] catheter based  [  ] both    Date:   Thrombectomy/ lysis/ revision:  [  x] no  [  ] yes- [  ] surg  [  ] catheter based  [  ] both    Date:   Major amputation: [ x ] no  [  ] minor amp  [  ] BKA  [  ] AKA   Date:

## 2013-02-10 NOTE — Patient Instructions (Addendum)
Peripheral Vascular Disease Peripheral Vascular Disease (PVD), also called Peripheral Arterial Disease (PAD), is a circulation problem caused by cholesterol (atherosclerotic plaque) deposits in the arteries. PVD commonly occurs in the lower extremities (legs) but it can occur in other areas of the body, such as your arms. The cholesterol buildup in the arteries reduces blood flow which can cause pain and other serious problems. The presence of PVD can place a person at risk for Coronary Artery Disease (CAD).  CAUSES  Causes of PVD can be many. It is usually associated with more than one risk factor such as:   High Cholesterol.  Smoking.  Diabetes.  Lack of exercise or inactivity.  High blood pressure (hypertension).  Obesity.  Family history. SYMPTOMS   When the lower extremities are affected, patients with PVD may experience:  Leg pain with exertion or physical activity. This is called INTERMITTENT CLAUDICATION. This may present as cramping or numbness with physical activity. The location of the pain is associated with the level of blockage. For example, blockage at the abdominal level (distal abdominal aorta) may result in buttock or hip pain. Lower leg arterial blockage may result in calf pain.  As PVD becomes more severe, pain can develop with less physical activity.  In people with severe PVD, leg pain may occur at rest.  Other PVD signs and symptoms:  Leg numbness or weakness.  Coldness in the affected leg or foot, especially when compared to the other leg.  A change in leg color.  Patients with significant PVD are more prone to ulcers or sores on toes, feet or legs. These may take longer to heal or may reoccur. The ulcers or sores can become infected.  If signs and symptoms of PVD are ignored, gangrene may occur. This can result in the loss of toes or loss of an entire limb.  Not all leg pain is related to PVD. Other medical conditions can cause leg pain such  as:  Blood clots (embolism) or Deep Vein Thrombosis.  Inflammation of the blood vessels (vasculitis).  Spinal stenosis. DIAGNOSIS  Diagnosis of PVD can involve several different types of tests. These can include:  Pulse Volume Recording Method (PVR). This test is simple, painless and does not involve the use of X-rays. PVR involves measuring and comparing the blood pressure in the arms and legs. An ABI (Ankle-Brachial Index) is calculated. The normal ratio of blood pressures is 1. As this number becomes smaller, it indicates more severe disease.  < 0.95  indicates significant narrowing in one or more leg vessels.  <0.8 there will usually be pain in the foot, leg or buttock with exercise.  <0.4 will usually have pain in the legs at rest.  <0.25  usually indicates limb threatening PVD.  Doppler detection of pulses in the legs. This test is painless and checks to see if you have a pulses in your legs/feet.  A dye or contrast material (a substance that highlights the blood vessels so they show up on x-ray) may be given to help your caregiver better see the arteries for the following tests. The dye is eliminated from your body by the kidney's. Your caregiver may order blood work to check your kidney function and other laboratory values before the following tests are performed:  Magnetic Resonance Angiography (MRA). An MRA is a picture study of the blood vessels and arteries. The MRA machine uses a large magnet to produce images of the blood vessels.  Computed Tomography Angiography (CTA). A CTA is a   specialized x-ray that looks at how the blood flows in your blood vessels. An IV may be inserted into your arm so contrast dye can be injected.  Angiogram. Is a procedure that uses x-rays to look at your blood vessels. This procedure is minimally invasive, meaning a small incision (cut) is made in your groin. A small tube (catheter) is then inserted into the artery of your groin. The catheter is  guided to the blood vessel or artery your caregiver wants to examine. Contrast dye is injected into the catheter. X-rays are then taken of the blood vessel or artery. After the images are obtained, the catheter is taken out. TREATMENT  Treatment of PVD involves many interventions which may include:  Lifestyle changes:  Quitting smoking.  Exercise.  Following a low fat, low cholesterol diet.  Control of diabetes.  Foot care is very important to the PVD patient. Good foot care can help prevent infection.  Medication:  Cholesterol-lowering medicine.  Blood pressure medicine.  Anti-platelet drugs.  Certain medicines may reduce symptoms of Intermittent Claudication.  Interventional/Surgical options:  Angioplasty. An Angioplasty is a procedure that inflates a balloon in the blocked artery. This opens the blocked artery to improve blood flow.  Stent Implant. A wire mesh tube (stent) is placed in the artery. The stent expands and stays in place, allowing the artery to remain open.  Peripheral Bypass Surgery. This is a surgical procedure that reroutes the blood around a blocked artery to help improve blood flow. This type of procedure may be performed if Angioplasty or stent implants are not an option. SEEK IMMEDIATE MEDICAL CARE IF:   You develop pain or numbness in your arms or legs.  Your arm or leg turns cold, becomes blue in color.  You develop redness, warmth, swelling and pain in your arms or legs. MAKE SURE YOU:   Understand these instructions.  Will watch your condition.  Will get help right away if you are not doing well or get worse. Document Released: 04/25/2004 Document Revised: 06/10/2011 Document Reviewed: 03/22/2008 [facility] Patient Information 2014 [place], [place].   Chair exercises: do several times/day, 10 repetitions each exercise and rest:  1) Straight leg lifts 2) bent knee leg lifts 3) toe to heel calf strengthening 4) arm chair upper body lift  (1-2 times and rest) 5) 2-5 pound weights or cans of beans, arm curls

## 2013-02-11 ENCOUNTER — Other Ambulatory Visit: Payer: Self-pay | Admitting: *Deleted

## 2013-02-11 ENCOUNTER — Telehealth: Payer: Self-pay | Admitting: Family

## 2013-02-11 NOTE — Telephone Encounter (Addendum)
Message copied by Fredrich Birks on Thu Feb 11, 2013  9:30 AM ------      Message from: [place], [place] K      Created: Thu Feb 11, 2013  8:04 AM      Regarding: schedule                   ----- Message -----         From: Annye Rusk, NP         Sent: 02/10/2013   8:43 PM           To: Sharee Pimple, CMA            Joyce Gross,      Please add carotid Duplex when patient returns in 6 months, in addition to LE studies, since she reports that she was hospitalized this Summer for a stroke; those records are not in EPIC.      Thanks,      Rosalita Chessman  ------  02/11/13: mailed updated AVS, lm for patient, dpm

## 2013-03-09 ENCOUNTER — Other Ambulatory Visit: Payer: Self-pay | Admitting: *Deleted

## 2013-03-09 DIAGNOSIS — I739 Peripheral vascular disease, unspecified: Secondary | ICD-10-CM

## 2013-03-09 DIAGNOSIS — Z48812 Encounter for surgical aftercare following surgery on the circulatory system: Secondary | ICD-10-CM

## 2013-08-10 ENCOUNTER — Ambulatory Visit: Payer: Medicaid - Out of State | Admitting: Family

## 2013-08-10 ENCOUNTER — Encounter (HOSPITAL_COMMUNITY): Payer: Medicaid - Out of State

## 2013-08-10 ENCOUNTER — Other Ambulatory Visit (HOSPITAL_COMMUNITY): Payer: Medicaid - Out of State

## 2013-08-11 ENCOUNTER — Other Ambulatory Visit (HOSPITAL_COMMUNITY): Payer: Medicaid - Out of State

## 2013-08-11 ENCOUNTER — Ambulatory Visit: Payer: Medicaid - Out of State | Admitting: Family

## 2013-08-11 ENCOUNTER — Encounter (HOSPITAL_COMMUNITY): Payer: Medicaid - Out of State

## 2013-08-13 ENCOUNTER — Other Ambulatory Visit (HOSPITAL_COMMUNITY): Payer: Medicaid - Out of State

## 2013-08-13 ENCOUNTER — Ambulatory Visit: Payer: Medicaid - Out of State | Admitting: Family

## 2013-08-13 ENCOUNTER — Encounter (HOSPITAL_COMMUNITY): Payer: Medicaid - Out of State

## 2013-08-20 ENCOUNTER — Ambulatory Visit: Payer: Medicaid - Out of State | Admitting: Family

## 2013-08-20 ENCOUNTER — Other Ambulatory Visit (HOSPITAL_COMMUNITY): Payer: Medicaid - Out of State

## 2013-08-20 ENCOUNTER — Encounter (HOSPITAL_COMMUNITY): Payer: Medicaid - Out of State

## 2013-08-26 ENCOUNTER — Encounter: Payer: Self-pay | Admitting: Family

## 2013-08-27 ENCOUNTER — Other Ambulatory Visit (HOSPITAL_COMMUNITY): Payer: Medicaid - Out of State

## 2013-08-27 ENCOUNTER — Encounter (HOSPITAL_COMMUNITY): Payer: Medicaid - Out of State

## 2013-08-27 ENCOUNTER — Ambulatory Visit: Payer: Medicaid - Out of State | Admitting: Family

## 2014-01-11 ENCOUNTER — Encounter: Payer: Self-pay | Admitting: Family

## 2014-01-12 ENCOUNTER — Encounter (HOSPITAL_COMMUNITY): Payer: Medicaid - Out of State

## 2014-01-12 ENCOUNTER — Ambulatory Visit: Payer: Medicaid - Out of State | Admitting: Family

## 2014-01-12 ENCOUNTER — Other Ambulatory Visit (HOSPITAL_COMMUNITY): Payer: Medicaid - Out of State

## 2014-01-12 ENCOUNTER — Inpatient Hospital Stay (HOSPITAL_COMMUNITY)
Admission: RE | Admit: 2014-01-12 | Discharge: 2014-01-12 | Disposition: A | Discharge status: Home / Self Care | Payer: Medicaid - Out of State | Source: Ambulatory Visit

## 2014-01-12 DIAGNOSIS — Z48812 Encounter for surgical aftercare following surgery on the circulatory system: Secondary | ICD-10-CM

## 2014-01-12 DIAGNOSIS — I739 Peripheral vascular disease, unspecified: Secondary | ICD-10-CM

## 2014-03-01 HISTORY — PX: COLON SURGERY: SHX602

## 2014-03-02 ENCOUNTER — Encounter (HOSPITAL_COMMUNITY): Payer: Medicaid - Out of State

## 2014-03-02 ENCOUNTER — Other Ambulatory Visit (HOSPITAL_COMMUNITY): Payer: Medicaid - Out of State

## 2014-03-02 ENCOUNTER — Ambulatory Visit: Payer: Medicaid - Out of State | Admitting: Family

## 2014-03-09 ENCOUNTER — Encounter (HOSPITAL_COMMUNITY): Payer: Self-pay | Admitting: Vascular Surgery

## 2014-05-03 ENCOUNTER — Encounter: Payer: Self-pay | Admitting: Family

## 2014-05-04 ENCOUNTER — Other Ambulatory Visit (HOSPITAL_COMMUNITY): Payer: Medicaid - Out of State

## 2014-05-04 ENCOUNTER — Ambulatory Visit: Payer: Medicaid - Out of State | Admitting: Family

## 2014-05-04 ENCOUNTER — Encounter (HOSPITAL_COMMUNITY): Payer: Medicaid - Out of State

## 2014-06-21 ENCOUNTER — Encounter: Payer: Self-pay | Admitting: Family

## 2014-06-22 ENCOUNTER — Ambulatory Visit (INDEPENDENT_AMBULATORY_CARE_PROVIDER_SITE_OTHER)
Admission: RE | Admit: 2014-06-22 | Discharge: 2014-06-22 | Disposition: A | Discharge status: Home / Self Care | Payer: Medicaid - Out of State | Source: Ambulatory Visit | Attending: Family | Admitting: Family

## 2014-06-22 ENCOUNTER — Other Ambulatory Visit: Payer: Self-pay | Admitting: Family

## 2014-06-22 ENCOUNTER — Ambulatory Visit (HOSPITAL_COMMUNITY)
Admission: RE | Admit: 2014-06-22 | Discharge: 2014-06-22 | Disposition: A | Discharge status: Home / Self Care | Payer: Medicaid - Out of State | Source: Ambulatory Visit | Attending: Family | Admitting: Family

## 2014-06-22 ENCOUNTER — Encounter: Payer: Self-pay | Admitting: Family

## 2014-06-22 ENCOUNTER — Ambulatory Visit (INDEPENDENT_AMBULATORY_CARE_PROVIDER_SITE_OTHER): Payer: Medicaid - Out of State | Admitting: Family

## 2014-06-22 VITALS — BP 124/68 | HR 64 | Resp 16 | Ht 68.0 in | Wt 255.0 lb

## 2014-06-22 DIAGNOSIS — I739 Peripheral vascular disease, unspecified: Secondary | ICD-10-CM

## 2014-06-22 DIAGNOSIS — E1159 Type 2 diabetes mellitus with other circulatory complications: Secondary | ICD-10-CM | POA: Diagnosis not present

## 2014-06-22 DIAGNOSIS — E785 Hyperlipidemia, unspecified: Secondary | ICD-10-CM | POA: Diagnosis not present

## 2014-06-22 DIAGNOSIS — Z48812 Encounter for surgical aftercare following surgery on the circulatory system: Secondary | ICD-10-CM

## 2014-06-22 DIAGNOSIS — E1151 Type 2 diabetes mellitus with diabetic peripheral angiopathy without gangrene: Secondary | ICD-10-CM

## 2014-06-22 DIAGNOSIS — Z87891 Personal history of nicotine dependence: Secondary | ICD-10-CM | POA: Insufficient documentation

## 2014-06-22 DIAGNOSIS — I6523 Occlusion and stenosis of bilateral carotid arteries: Secondary | ICD-10-CM | POA: Diagnosis not present

## 2014-06-22 DIAGNOSIS — Z9889 Other specified postprocedural states: Secondary | ICD-10-CM | POA: Diagnosis not present

## 2014-06-22 DIAGNOSIS — E119 Type 2 diabetes mellitus without complications: Secondary | ICD-10-CM | POA: Diagnosis not present

## 2014-06-22 DIAGNOSIS — I10 Essential (primary) hypertension: Secondary | ICD-10-CM | POA: Insufficient documentation

## 2014-06-22 DIAGNOSIS — Z95828 Presence of other vascular implants and grafts: Secondary | ICD-10-CM

## 2014-06-22 DIAGNOSIS — R600 Localized edema: Secondary | ICD-10-CM

## 2014-06-22 NOTE — Patient Instructions (Addendum)

## 2014-06-22 NOTE — Progress Notes (Signed)
[facility] [place] HISTORY AND PHYSICAL -PAD  History of Present Illness Pamela [name] is a 64 y.o. female patient of Dr. Edilia Bo who is status post left femoropopliteal bypass graft and left transmetatarsal amputation for extensive wound of the left foot on 04/04/11. She returns today for routine follow up. She was last seen in our office in November 2014; at that time she was advised to returns in 6 months for ABI's and bilat. LE arterial Duplex.  She fell about the time she was to return.   Stroke symptoms in the Summer of 2014; pt states evaluated by a neurologist who said it was not a stroke, was related to how she took her medications, she eventually was intubated. Stroke was manifested as confusion when taking her medications, denies MI or infection at this time.  No lateralizing symptoms at this time, just generalized weakness. Has some residual mild memory issues.  Patient reports an infection with perforated colon, needed colectomy end of 2015, hospitalized until February 2016, states she is deconditioned from this, received physical therapy for strengthening.  She is unsteady on her feet, denies non healing wounds. She has been told that she has gout in her partially amputated left foot, and the ruddy color is secondary to gout.  Pt Diabetic: Yes, states her last A1C was about 7.6 Pt smoker: former smoker, quit about 2012  Pt meds include: Statin :No, states all statins caused chest pain ASA: yes Other anticoagulants/antiplatelets: no, does not want to take as she states she relates this to her brother hemorrhaged while on anticoagulants and died.    Past Medical History  Diagnosis Date  . Diabetes mellitus   . GERD (gastroesophageal reflux disease)   . Gout   . Hypertension   . High cholesterol   . Arthritis   . COPD (chronic obstructive pulmonary disease)   . Peripheral arterial disease     Social History History  Substance Use Topics  .  Smoking status: Former Smoker -- 0.00 packs/day for 45 years    Types: Cigarettes    Quit date: 04/12/2011  . Smokeless tobacco: Never Used  . Alcohol Use: No    Family History Family History  Problem Relation Age of Onset  . Hyperlipidemia Father   . Heart disease Father     Heart Disease before age 12  . Heart attack Father   . Cancer Father     Throat  . Heart disease Brother     Heart Disease before age 48  . Hyperlipidemia Brother   . Hypertension Brother   . Stroke Brother   . Heart attack Brother   . Hypertension Mother   . COPD Brother   . Hyperlipidemia Brother   . Hypertension Brother   . COPD Brother   . Hyperlipidemia Brother   . Hypertension Brother     Past Surgical History  Procedure Laterality Date  . Cesarean section  04/11/1986  . Femoral-popliteal bypass graft  04/04/2011    Procedure: BYPASS GRAFT FEMORAL-POPLITEAL ARTERY;  Surgeon: Chuck Hint, MD;  Location: [facility] OR;  Service: Vascular;  Laterality: Left;  left femoral-above knee popliteal bypass graft using left  non reverse greater saphenous vein  . Amputation  04/04/2011    Procedure: AMPUTATION DIGIT;  Surgeon: Chuck Hint, MD;  Location: [facility] OR;  Service: Vascular;  Laterality: Left;   left forefoot amputation  . Eye surgery      Bilateral cataract  . Abdominal angiogram N/A 04/01/2011  Procedure: ABDOMINAL ANGIOGRAM;  Surgeon: Chuck Hint, MD;  Location: [facility] CATH LAB;  Service: Cardiovascular;  Laterality: N/A;  . Lower extremity angiogram  04/01/2011    Procedure: LOWER EXTREMITY ANGIOGRAM;  Surgeon: Chuck Hint, MD;  Location: [facility] CATH LAB;  Service: Cardiovascular;;  . Colon surgery  Dec. 2015    Allergies  Allergen Reactions  . Azo [Phenazopyridine Hcl] Itching, Swelling and Rash  . Ciprofloxacin     According to MD notes pt has allergy to Cipro  . Codeine Itching and Rash    Current Outpatient Prescriptions  Medication Sig Dispense Refill  .  allopurinol (ZYLOPRIM) 300 MG tablet Take 300 mg by mouth daily.      Marland Kitchen amLODipine (NORVASC) 10 MG tablet Take 10 mg by mouth daily.      . B Complex-C-Min-Fe-FA (HEMATINIC PLUS VIT/MINERALS) TABS     . B-D ULTRAFINE III SHORT PEN 31G X 8 MM MISC     . BAYER BREEZE 2 TEST DISK     . BAYER MICROLET LANCETS lancets     . BD INSULIN SYRINGE ULTRAFINE 31G X 5/16" 0.3 ML MISC     . Cyanocobalamin (VITAMIN B-12 PO) Take 1 tablet by mouth daily.      . ergocalciferol (VITAMIN D2) 50000 UNITS capsule Take 50,000 Units by mouth once a week.     . hydrOXYzine (ATARAX/VISTARIL) 25 MG tablet     . losartan (COZAAR) 100 MG tablet     . omega-3 acid ethyl esters (LOVAZA) 1 G capsule Take 4 g by mouth daily.      Marland Kitchen oxyCODONE (OXY IR/ROXICODONE) 5 MG immediate release tablet Take 5 mg by mouth every 8 (eight) hours.  0  . silver sulfADIAZINE (SILVADENE) 1 % cream continuous as needed.    . VENTOLIN HFA 108 (90 BASE) MCG/ACT inhaler     . amoxicillin (AMOXIL) 500 MG capsule Take 1 capsule by mouth 2 (two) times daily.    Marland Kitchen atorvastatin (LIPITOR) 10 MG tablet Take 10 mg by mouth daily.      . cyclobenzaprine (FLEXERIL) 10 MG tablet Take 10 mg by mouth 3 (three) times daily as needed.    . diazepam (VALIUM) 5 MG tablet Take 1 tablet by mouth every 6 (six) hours.    . ferrous sulfate 325 (65 FE) MG tablet Take 1 tablet (325 mg total) by mouth 2 (two) times daily with a meal.    . gabapentin (NEURONTIN) 800 MG tablet Take 800 mg by mouth 3 (three) times daily.      . insulin aspart (NOVOLOG) 100 UNIT/ML injection Inject 0-15 Units into the skin 3 (three) times daily with meals. 1 vial   . insulin aspart (NOVOLOG) 100 UNIT/ML injection Inject 0-5 Units into the skin at bedtime. 1 vial   . insulin glargine (LANTUS) 100 UNIT/ML injection Inject 26 Units into the skin at bedtime. 10 mL   . insulin glargine (LANTUS) 100 UNIT/ML injection Inject 30 Units into the skin daily. 10 mL   . metoprolol tartrate (LOPRESSOR)  25 MG tablet Take 1 tablet (25 mg total) by mouth 2 (two) times daily.    Marland Kitchen oxyCODONE-acetaminophen (PERCOCET) 5-325 MG per tablet Take 1 tablet by mouth every 4 (four) hours as needed.    . sulfamethoxazole-trimethoprim (BACTRIM DS) 800-160 MG per tablet Take 1 tablet by mouth 2 (two) times daily.    Marland Kitchen zolpidem (AMBIEN) 5 MG tablet Take 1 tablet (5 mg total) by mouth at bedtime as  needed for sleep (insomnia). 30 tablet 0   No current facility-administered medications for this visit.    ROS: See HPI for pertinent positives and negatives.   Physical Examination  Filed Vitals:   06/22/14 1215  BP: 124/68  Pulse: 64  Resp: 16  Height:  (1.727 m)  Weight: 255 lb (115.667 kg)  SpO2: 95%   Body mass index is 38.78 kg/(m^2).  General: A&O x 3, WDWN, obese female. Gait: in wheelchair from home Eyes: PERRLA, Pulmonary: CTAB, without wheezes , rales or rhonchi Cardiac: regular Rythm , without murmur     Carotid Bruits Left Right   Negative Negative  Radial pulses: are 2+ and =   VASCULAR EXAM: Extremities without ischemic changes: no dependent rubor, no cyanosis. Left foot transmetatarsal amputation stump is well healed. without Gangrene; without open wounds. Left LE with 2+ pretibial pitting edema.     LE Pulses LEFT RIGHT       FEMORAL Not palpable sitting in w/c, obese Not palpable sitting in w/c, obese   POPLITEAL not palpable  not palpable   POSTERIOR TIBIAL monophasic by Doppler and not palpable  monophasic by Doppler and not palpable    DORSALIS PEDIS  ANTERIOR TIBIAL biphasic by Doppler and not palpable  triphasic by Doppler and not palpable    Abdomen: soft, NT, no palpable masses. Skin: no rashes, no ulcers. Musculoskeletal: no muscle wasting or  atrophy. Neurologic: A&O X 3; Appropriate Affect ; SENSATION: normal; MOTOR FUNCTION: moving all extremities equally, motor strength 4/5 throughout. Speech is fluent/normal. CN 2-12 intact.          Non-Invasive Vascular Imaging: DATE: 06/22/2014   CEREBROVASCULAR DUPLEX EVALUATION    INDICATION: Cardiovascular accident.     PREVIOUS INTERVENTION(S):     DUPLEX EXAM:     RIGHT  LEFT  Peak Systolic Velocities (cm/s) End Diastolic Velocities (cm/s) Plaque LOCATION Peak Systolic Velocities (cm/s) End Diastolic Velocities (cm/s) Plaque  84 13  CCA PROXIMAL 97 11   89 11  CCA MID 88 13   69 13  CCA DISTAL 90 14   135 9  ECA 90 10   82 15 HT ICA PROXIMAL 84 14 HT  95 24  ICA MID 64 16   64 16  ICA DISTAL 76 12     1.06 ICA / CCA Ratio (PSV) 0.95  Antegrade  Vertebral Flow Antegrade   155 Brachial Systolic Pressure (mmHg) 152  Triphasic  Brachial Artery Waveforms Triphasic     Plaque Morphology:  HM = Homogeneous, HT = Heterogeneous, CP = Calcific Plaque, SP = Smooth Plaque, IP = Irregular Plaque  ADDITIONAL FINDINGS:     IMPRESSION: Bilateral internal carotid artery velocities suggest a <40% stenosis.     Compared to the previous exam:  No prior exam performed at this facility for comparison.       LOWER EXTREMITY ARTERIAL DUPLEX EVALUATION    INDICATION: Peripheral vascular disease     PREVIOUS INTERVENTION(S): Left femoropopliteal bypass graft placed 04/04/2011. Left transmetatarsal amputation.    DUPLEX EXAM:     RIGHT  LEFT   Peak Systolic Velocity (cm/s) Ratio (if abnormal) Waveform  Peak Systolic Velocity (cm/s) Ratio (if abnormal) Waveform     Inflow Artery 261  T      Proximal Anastomosis 69  T     Proximal Graft 46  T     Mid Graft 94  T      Distal Graft 105  T  Distal Anastomosis 108  T     Outflow Artery 119  B  Non-compressible Today's ABI / TBI Non-compressible   0.56 Previous ABI / TBI (02/10/2013  ) 0.93    Waveform:    M -  Monophasic       B - Biphasic       T - Triphasic  If Ankle Brachial Index (ABI) or Toe Brachial Index (TBI) performed, please see complete report     ADDITIONAL FINDINGS:     IMPRESSION: Patent left femoropopliteal artery bypass graft without evidence of stenosis within the graft. Elevated velocities noted in the common femoral artery.    Compared to the previous exam:  The bypass graft appears essentially unchanged. A comparison of ankle brachial indices cannot be determined due to the presence of non-compressible vessels on today's exam.     ASSESSMENT: Pamela [name] is a 64 y.o. female who is status post left femoropopliteal bypass graft and left transmetatarsal amputation for extensive wound of the left foot on 04/04/11. Her DM is under slightly better control, she stopped tobacco use in 2012. She does not walk much to elicit claudication symptoms as she is deconditioned after a series of health setbacks as described in HPI. She gets around in a wheelchair, sometimes uses a walker. She is receiving physical therapy, daughter in law states pt is not performing leg exercises very much. She has no tissue loss in her lower extremities.  Left LE arterial Duplex today reveals   Stroke symptoms in 2014 determined by her neurologist not to be a stroke nor TIA. Carotid Duplex today reveal minimal bilateral ICA stenosis.  Edema remains in left lower leg with some mild chronic venous stasis changes, see Plan.  Face to face time with patient was 25 minutes. Over 50% of this time was spent on counseling and coordination of care.   PLAN:  PAD: Daily seated leg exercises as given to her by physical therapist to perform at home. Left lower leg edema: discussed with pt and daughter how to measure for knee high graduated compression hose, 20-30 mm Hg pressure, wear during the day.  ETI information given (discount medical grade compression hose outlet).  I discussed in depth with the patient  the nature of atherosclerosis, and emphasized the importance of maximal medical management including strict control of blood pressure, blood glucose, and lipid levels, obtaining regular exercise, and continued cessation of smoking.  The patient is aware that without maximal medical management the underlying atherosclerotic disease process will progress, limiting the benefit of any interventions.  Based on the patient's vascular studies and examination, pt will return to clinic in 6 months with ABI's and left LE arterial Duplex  The patient was given information about PAD including signs, symptoms, treatment, what symptoms should prompt the patient to seek immediate medical care, and risk reduction measures to take.  Charisse MarchSuzanne Rockey Guarino, RN, MSN, FNP-C [facility] [place] Office Phone: [phone]  Clinic MD: Edilia BoDickson  06/22/2014 12:34 PM

## 2014-11-16 ENCOUNTER — Encounter: Payer: Self-pay | Admitting: Family

## 2014-11-17 ENCOUNTER — Encounter: Payer: Self-pay | Admitting: Family

## 2014-11-17 ENCOUNTER — Ambulatory Visit (INDEPENDENT_AMBULATORY_CARE_PROVIDER_SITE_OTHER)
Admission: RE | Admit: 2014-11-17 | Discharge: 2014-11-17 | Disposition: A | Discharge status: Home / Self Care | Payer: Medicare Other | Source: Ambulatory Visit | Attending: Family | Admitting: Family

## 2014-11-17 ENCOUNTER — Ambulatory Visit (INDEPENDENT_AMBULATORY_CARE_PROVIDER_SITE_OTHER): Payer: Medicare Other | Admitting: Family

## 2014-11-17 ENCOUNTER — Other Ambulatory Visit: Payer: Self-pay | Admitting: Family

## 2014-11-17 ENCOUNTER — Ambulatory Visit (HOSPITAL_COMMUNITY)
Admission: RE | Admit: 2014-11-17 | Discharge: 2014-11-17 | Disposition: A | Discharge status: Home / Self Care | Payer: Medicare Other | Source: Ambulatory Visit | Attending: Family | Admitting: Family

## 2014-11-17 VITALS — BP 144/79 | HR 77 | Temp 97.0°F | Resp 16 | Ht 68.0 in | Wt 255.0 lb

## 2014-11-17 DIAGNOSIS — E785 Hyperlipidemia, unspecified: Secondary | ICD-10-CM | POA: Insufficient documentation

## 2014-11-17 DIAGNOSIS — I739 Peripheral vascular disease, unspecified: Secondary | ICD-10-CM

## 2014-11-17 DIAGNOSIS — Z87891 Personal history of nicotine dependence: Secondary | ICD-10-CM

## 2014-11-17 DIAGNOSIS — M5416 Radiculopathy, lumbar region: Secondary | ICD-10-CM

## 2014-11-17 DIAGNOSIS — Z95828 Presence of other vascular implants and grafts: Secondary | ICD-10-CM

## 2014-11-17 DIAGNOSIS — L97421 Non-pressure chronic ulcer of left heel and midfoot limited to breakdown of skin: Secondary | ICD-10-CM

## 2014-11-17 DIAGNOSIS — I6523 Occlusion and stenosis of bilateral carotid arteries: Secondary | ICD-10-CM

## 2014-11-17 DIAGNOSIS — E1151 Type 2 diabetes mellitus with diabetic peripheral angiopathy without gangrene: Secondary | ICD-10-CM

## 2014-11-17 DIAGNOSIS — E119 Type 2 diabetes mellitus without complications: Secondary | ICD-10-CM | POA: Insufficient documentation

## 2014-11-17 DIAGNOSIS — Z9889 Other specified postprocedural states: Secondary | ICD-10-CM

## 2014-11-17 DIAGNOSIS — R600 Localized edema: Secondary | ICD-10-CM | POA: Insufficient documentation

## 2014-11-17 DIAGNOSIS — E1159 Type 2 diabetes mellitus with other circulatory complications: Secondary | ICD-10-CM

## 2014-11-17 DIAGNOSIS — I10 Essential (primary) hypertension: Secondary | ICD-10-CM | POA: Insufficient documentation

## 2014-11-17 NOTE — Progress Notes (Signed)
Filed Vitals:   11/17/14 1554 11/17/14 1605  BP: 140/82 144/79  Pulse: 81 77  Temp: 97 F (36.1 C)   TempSrc: Oral   Resp: 16   Height:  (1.727 m)   Weight: 255 lb (115.667 kg)   SpO2: 95%

## 2014-11-17 NOTE — Patient Instructions (Signed)

## 2014-11-17 NOTE — Progress Notes (Signed)
[facility] [place] HISTORY AND PHYSICAL -PAD  History of Present Illness Pamela [name] is a 64 y.o. female patient of Dr. Edilia Bo who is status post left femoropopliteal bypass graft and left transmetatarsal amputation for extensive wound of the left foot on 04/04/11. She has a history of ulcers on both feet. She returns today for sudden onset low back to left lateral leg pain that started about 1 1/2 weeks ago when she got up to get out of bed. The pain has been constant since then, is partially alleviated by position change lying in bed.  Her podiatrist, Dr. Su Hoff, is treating her left heel ulcer. Pt's friend with her states that she moves her feet around in be a great deal and her heel ulcer developed from this shearing type action.  Stroke symptoms in the Summer of 2014; pt states evaluated by a neurologist who said it was not a stroke, was related to how she took her medications, she eventually was intubated. Stroke was manifested as confusion when taking her medications, denies MI or infection at this time. Carotid Duplex on 06/22/14 suggested minimal bilateral ICA stenosis.   No lateralizing symptoms at this time, just generalized weakness. Has some residual mild memory issues.  Patient reports an infection with perforated colon, needed colectomy end of 2015, hospitalized until February 2016, states she is deconditioned from this, received physical therapy for strengthening.  She is unsteady on her feet. She has been told that she has gout in her partially amputated left foot.  Pt Diabetic: Yes, states her last A1C was about 7.6 Pt smoker: former smoker, quit about 2012  Pt meds include: Statin :No, states all statins caused chest pain ASA: yes Other anticoagulants/antiplatelets: no, does not want to take as she states she relates this to her brother hemorrhaged while on anticoagulants and died.     Past Medical History  Diagnosis Date  . Diabetes  mellitus   . GERD (gastroesophageal reflux disease)   . Gout   . Hypertension   . High cholesterol   . Arthritis   . COPD (chronic obstructive pulmonary disease)   . Peripheral arterial disease     Social History Social History  Substance Use Topics  . Smoking status: Former Smoker -- 0.00 packs/day for 45 years    Types: Cigarettes    Quit date: 04/12/2011  . Smokeless tobacco: Never Used  . Alcohol Use: No    Family History Family History  Problem Relation Age of Onset  . Hyperlipidemia Father   . Heart disease Father     Heart Disease before age 8  . Heart attack Father   . Cancer Father     Throat  . Heart disease Brother     Heart Disease before age 45  . Hyperlipidemia Brother   . Hypertension Brother   . Stroke Brother   . Heart attack Brother   . Hypertension Mother   . COPD Brother   . Hyperlipidemia Brother   . Hypertension Brother   . COPD Brother   . Hyperlipidemia Brother   . Hypertension Brother     Past Surgical History  Procedure Laterality Date  . Cesarean section  04/11/1986  . Femoral-popliteal bypass graft  04/04/2011    Procedure: BYPASS GRAFT FEMORAL-POPLITEAL ARTERY;  Surgeon: Chuck Hint, MD;  Location: [facility] OR;  Service: Vascular;  Laterality: Left;  left femoral-above knee popliteal bypass graft using left  non reverse greater saphenous vein  . Amputation  04/04/2011  Procedure: AMPUTATION DIGIT;  Surgeon: Chuck Hint, MD;  Location: [facility] OR;  Service: Vascular;  Laterality: Left;   left forefoot amputation  . Eye surgery      Bilateral cataract  . Abdominal angiogram N/A 04/01/2011    Procedure: ABDOMINAL ANGIOGRAM;  Surgeon: Chuck Hint, MD;  Location: [facility] CATH LAB;  Service: Cardiovascular;  Laterality: N/A;  . Lower extremity angiogram  04/01/2011    Procedure: LOWER EXTREMITY ANGIOGRAM;  Surgeon: Chuck Hint, MD;  Location: [facility] CATH LAB;  Service: Cardiovascular;;  . Colon surgery  Dec. 2015     Partial Colon, Ovary and Uterine surgery    Allergies  Allergen Reactions  . Azo [Phenazopyridine Hcl] Itching, Swelling and Rash  . Ciprofloxacin     According to MD notes pt has allergy to Cipro  . Codeine Itching and Rash    Current Outpatient Prescriptions  Medication Sig Dispense Refill  . allopurinol (ZYLOPRIM) 300 MG tablet Take 300 mg by mouth daily.      Marland Kitchen amLODipine (NORVASC) 10 MG tablet Take 10 mg by mouth daily.      Marland Kitchen amoxicillin (AMOXIL) 500 MG capsule Take 1 capsule by mouth 2 (two) times daily.    Marland Kitchen atorvastatin (LIPITOR) 10 MG tablet Take 10 mg by mouth daily.      . B Complex-C-Min-Fe-FA (HEMATINIC PLUS VIT/MINERALS) TABS     . B-D ULTRAFINE III SHORT PEN 31G X 8 MM MISC     . BAYER BREEZE 2 TEST DISK     . BAYER MICROLET LANCETS lancets     . BD INSULIN SYRINGE ULTRAFINE 31G X 5/16" 0.3 ML MISC     . Cyanocobalamin (VITAMIN B-12 PO) Take 1 tablet by mouth daily.      . cyclobenzaprine (FLEXERIL) 10 MG tablet Take 10 mg by mouth 3 (three) times daily as needed.    . diazepam (VALIUM) 5 MG tablet Take 1 tablet by mouth every 6 (six) hours.    . ergocalciferol (VITAMIN D2) 50000 UNITS capsule Take 50,000 Units by mouth once a week.     . ferrous sulfate 325 (65 FE) MG tablet Take 1 tablet (325 mg total) by mouth 2 (two) times daily with a meal.    . gabapentin (NEURONTIN) 800 MG tablet Take 800 mg by mouth 3 (three) times daily.      . hydrOXYzine (ATARAX/VISTARIL) 25 MG tablet     . insulin aspart (NOVOLOG) 100 UNIT/ML injection Inject 0-15 Units into the skin 3 (three) times daily with meals. 1 vial   . insulin aspart (NOVOLOG) 100 UNIT/ML injection Inject 0-5 Units into the skin at bedtime. 1 vial   . insulin glargine (LANTUS) 100 UNIT/ML injection Inject 26 Units into the skin at bedtime. 10 mL   . insulin glargine (LANTUS) 100 UNIT/ML injection Inject 30 Units into the skin daily. 10 mL   . losartan (COZAAR) 100 MG tablet     . metoprolol tartrate (LOPRESSOR)  25 MG tablet Take 1 tablet (25 mg total) by mouth 2 (two) times daily.    Marland Kitchen omega-3 acid ethyl esters (LOVAZA) 1 G capsule Take 4 g by mouth daily.      Marland Kitchen oxyCODONE (OXY IR/ROXICODONE) 5 MG immediate release tablet Take 5 mg by mouth every 8 (eight) hours.  0  . oxyCODONE-acetaminophen (PERCOCET) 5-325 MG per tablet Take 1 tablet by mouth every 4 (four) hours as needed.    . silver sulfADIAZINE (SILVADENE) 1 % cream continuous as  needed.    . sulfamethoxazole-trimethoprim (BACTRIM DS) 800-160 MG per tablet Take 1 tablet by mouth 2 (two) times daily.    . VENTOLIN HFA 108 (90 BASE) MCG/ACT inhaler     . zolpidem (AMBIEN) 5 MG tablet Take 1 tablet (5 mg total) by mouth at bedtime as needed for sleep (insomnia). 30 tablet 0   No current facility-administered medications for this visit.    ROS: See HPI for pertinent positives and negatives.   Physical Examination  Filed Vitals:   11/17/14 1554 11/17/14 1605  BP: 140/82 144/79  Pulse: 81 77  Temp: 97 F (36.1 C)   TempSrc: Oral   Resp: 16   Height: 5\' 8"  (1.727 m)   Weight: 255 lb (115.667 kg)   SpO2: 95%    Body mass index is 38.78 kg/(m^2).   General: A&O x 3, WDWN, obese female. Gait: in wheelchair from home Eyes: PERRLA, Pulmonary: CTAB, without wheezes , rales or rhonchi Cardiac: regular Rythm , without murmur     Carotid Bruits Left Right   Negative Negative  Radial pulses: are 2+ and =   VASCULAR EXAM: Extremities without ischemic changes: no dependent rubor, no cyanosis. Left foot transmetatarsal amputation stump is well healed. without Gangrene; with open wound on medial aspect left heel, 6 cm x 4.5 cm, epidermal layer deep. Bilateral LE with trace pretibial pitting edema.     LE Pulses LEFT RIGHT   FEMORAL palpable   Not palpable sitting in w/c, obese   POPLITEAL not palpable  not palpable   POSTERIOR TIBIAL triphasic by Doppler and not palpable  monophasic by Doppler and not palpable    DORSALIS PEDIS  ANTERIOR TIBIAL triphasic by Doppler and 2+ palpable  monophasic by Doppler and not palpable    Abdomen: soft, NT, no palpable masses. Skin: no rashes, see Extremities. Musculoskeletal: no muscle wasting or atrophy. Neurologic: A&O X 3; Appropriate Affect; MOTOR FUNCTION: moving all extremities equally, motor strength 4/5 throughout. Speech is fluent/normal. CN 2-12 intact.                  Non-Invasive Vascular Imaging: DATE: 11/17/2014 LOWER EXTREMITY ARTERIAL DUPLEX EVALUATION    INDICATION: New onset of rest pain s/p left lower extremity femoropopliteal arterial bypass graft     PREVIOUS INTERVENTION(S): Left femoropopliteal arterial bypass graft placed 04/04/2011 Left transmetatarsal amputation    DUPLEX EXAM:     RIGHT  LEFT   Peak Systolic Velocity (cm/s) Ratio (if abnormal) Waveform  Peak Systolic Velocity (cm/s) Ratio (if abnormal) Waveform     Inflow Artery 217  T     Proximal Anastomosis 202  T     Proximal Graft 108  T     Mid Graft 93  T      Distal Graft 96  T     Distal Anastomosis NV  NV     Outflow Artery 160  T  [place]/ .76 Today's ABI / TBI [place]/NA  [place]/ .64 Previous ABI / TBI (  ) [place]/NA    Waveform:    M - Monophasic       B - Biphasic       T - Triphasic  If Ankle Brachial Index (ABI) or Toe Brachial Index (TBI) performed, please see complete report     ADDITIONAL FINDINGS:     IMPRESSION: Widely patent left femoropopliteal arterial bypass graft without evidence of restenosis or hyperplasia. Distal anastomosis could not be visualized; outflow is within normal limits.  Compared to the previous exam:  No significant change compared to prior exam.      ASSESSMENT: Pamela [name] is a 64 y.o. female who is  status post left femoropopliteal bypass graft and left transmetatarsal amputation for extensive wound of the left foot on 04/04/11. Her DM is under slightly better control, she stopped tobacco use in 2012. She does not walk much to elicit claudication symptoms as she is deconditioned after a series of health setbacks as described in HPI. She gets around in a wheelchair, sometimes uses a walker. She has a history of ulcers on both feet. She returns today for sudden onset low back to left lateral leg pain that started about 1 1/2 weeks ago when she got up to get out of bed. The pain has been constant since then, is partially alleviated by position change lying in bed.  Her podiatrist, Dr. Su Hoff, is treating her left heel ulcer. Pt's friend with her states that she moves her feet around in be a great deal and her heel ulcer developed from this shearing type action. Today's left LE arterial Duplex suggests a wdely patent left femoropopliteal arterial bypass graft without evidence of restenosis or hyperplasia. Distal anastomosis could not be visualized; outflow is within normal limits. Left DP and PT pulses are triphasic by Doppler. Right LE waveforms are hyperemic, strongly monophasic/borderline triphasic. Right toe pressure is normal.  Left leg pain is not from lack of arterial perfusion.  Pt advised to follow up with her PCP re left radiculopathy like symptoms.  Continue with podiatrist, Dr. Su Hoff, treatment of slowly healing left heel ulcer. Pt advised to wear feet protection in bed to prevent shearing action on her sheets.  Face to face time with patient was 25 minutes. Over 50% of this time was spent on counseling and coordination of care.   PLAN:  Based on the patient's vascular studies and examination, pt will return to clinic in 6 months with ABI's and left LE arterial Duplex; she knows to return sooner if needed.  May cancel appointment for next month. I discussed in depth with the  patient the nature of atherosclerosis, and emphasized the importance of maximal medical management including strict control of blood pressure, blood glucose, and lipid levels, obtaining regular exercise, and continued cessation of smoking.  The patient is aware that without maximal medical management the underlying atherosclerotic disease process will progress, limiting the benefit of any interventions.  The patient was given information about PAD including signs, symptoms, treatment, what symptoms should prompt the patient to seek immediate medical care, and risk reduction measures to take.  Charisse March, RN, MSN, FNP-C [facility] [place] Phone: [phone]  Clinic MD: Darrick Penna  11/17/2014 3:57 PM

## 2014-11-18 NOTE — Addendum Note (Signed)
Addended by: Adria Dill L on: 11/18/2014 11:38 AM   Modules accepted: Orders

## 2014-12-28 ENCOUNTER — Encounter (HOSPITAL_COMMUNITY): Payer: Medicaid - Out of State

## 2014-12-28 ENCOUNTER — Other Ambulatory Visit (HOSPITAL_COMMUNITY): Payer: Medicaid - Out of State

## 2014-12-28 ENCOUNTER — Ambulatory Visit: Payer: Medicaid - Out of State | Admitting: Family

## 2015-04-26 ENCOUNTER — Telehealth: Payer: Self-pay | Admitting: Family

## 2015-04-26 NOTE — Telephone Encounter (Signed)
Tried to call patient today, but was told that she has not yet been discharged. Will try to call again later today or tomorrow. dpm

## 2015-04-26 NOTE — Telephone Encounter (Signed)
-----   Message from Annye Rusk, NP sent at 04/25/2015  6:27 PM EST ----- Regarding: add pt to my schedule for 05/03/15 Schedulers,  Please add pt to my schedule for May 03, 2015. She needs to be seen by Dr. Edilia Bo, but he has 27 pts on his schedule that day. She is a pt on the 5th floor of [facility] in [place], [place] as of today, but the general surgeon I spoke with today, Dr. Raeanne Barry, said he plans to send her home so we can see her as an outpatient instead of a hospital to hospital transfer.

## 2015-04-28 NOTE — Telephone Encounter (Signed)
LM for patient with appt information, dpm

## 2015-05-01 ENCOUNTER — Encounter: Payer: Self-pay | Admitting: Family

## 2015-05-03 ENCOUNTER — Encounter: Payer: Self-pay | Admitting: Family

## 2015-05-03 ENCOUNTER — Ambulatory Visit: Payer: Medicare Other | Admitting: Family

## 2015-05-09 ENCOUNTER — Ambulatory Visit: Payer: Medicare Other | Admitting: Family

## 2015-05-19 ENCOUNTER — Encounter: Payer: Self-pay | Admitting: Family

## 2015-05-24 ENCOUNTER — Encounter (HOSPITAL_COMMUNITY): Payer: Medicare Other

## 2015-05-24 ENCOUNTER — Other Ambulatory Visit (HOSPITAL_COMMUNITY): Payer: Medicare Other

## 2015-05-24 ENCOUNTER — Ambulatory Visit: Payer: Medicare Other | Admitting: Family

## 2015-07-19 ENCOUNTER — Encounter: Payer: Self-pay | Admitting: Family

## 2015-07-26 ENCOUNTER — Ambulatory Visit (INDEPENDENT_AMBULATORY_CARE_PROVIDER_SITE_OTHER): Payer: Medicare Other | Admitting: Family

## 2015-07-26 ENCOUNTER — Ambulatory Visit (HOSPITAL_COMMUNITY)
Admission: RE | Admit: 2015-07-26 | Discharge: 2015-07-26 | Disposition: A | Discharge status: Home / Self Care | Payer: Medicare Other | Source: Ambulatory Visit | Attending: Family | Admitting: Family

## 2015-07-26 ENCOUNTER — Ambulatory Visit (INDEPENDENT_AMBULATORY_CARE_PROVIDER_SITE_OTHER)
Admission: RE | Admit: 2015-07-26 | Discharge: 2015-07-26 | Disposition: A | Discharge status: Home / Self Care | Payer: Medicare Other | Source: Ambulatory Visit | Attending: Family | Admitting: Family

## 2015-07-26 ENCOUNTER — Encounter: Payer: Self-pay | Admitting: Family

## 2015-07-26 VITALS — BP 134/73 | HR 78 | Temp 98.4°F | Ht 68.0 in | Wt 201.0 lb

## 2015-07-26 DIAGNOSIS — Z95828 Presence of other vascular implants and grafts: Secondary | ICD-10-CM | POA: Diagnosis not present

## 2015-07-26 DIAGNOSIS — I10 Essential (primary) hypertension: Secondary | ICD-10-CM | POA: Insufficient documentation

## 2015-07-26 DIAGNOSIS — E7800 Pure hypercholesterolemia, unspecified: Secondary | ICD-10-CM | POA: Diagnosis not present

## 2015-07-26 DIAGNOSIS — R938 Abnormal findings on diagnostic imaging of other specified body structures: Secondary | ICD-10-CM | POA: Insufficient documentation

## 2015-07-26 DIAGNOSIS — M5416 Radiculopathy, lumbar region: Secondary | ICD-10-CM | POA: Diagnosis not present

## 2015-07-26 DIAGNOSIS — E1151 Type 2 diabetes mellitus with diabetic peripheral angiopathy without gangrene: Secondary | ICD-10-CM

## 2015-07-26 DIAGNOSIS — I739 Peripheral vascular disease, unspecified: Secondary | ICD-10-CM | POA: Diagnosis not present

## 2015-07-26 DIAGNOSIS — Z87891 Personal history of nicotine dependence: Secondary | ICD-10-CM | POA: Diagnosis not present

## 2015-07-26 DIAGNOSIS — K219 Gastro-esophageal reflux disease without esophagitis: Secondary | ICD-10-CM | POA: Insufficient documentation

## 2015-07-26 DIAGNOSIS — L97421 Non-pressure chronic ulcer of left heel and midfoot limited to breakdown of skin: Secondary | ICD-10-CM | POA: Insufficient documentation

## 2015-07-26 DIAGNOSIS — R0989 Other specified symptoms and signs involving the circulatory and respiratory systems: Secondary | ICD-10-CM | POA: Diagnosis present

## 2015-07-26 NOTE — Patient Instructions (Signed)
Peripheral Vascular Disease Peripheral vascular disease (PVD) is a disease of the blood vessels that are not part of your heart and brain. A simple term for PVD is poor circulation. In most cases, PVD narrows the blood vessels that carry blood from your heart to the rest of your body. This can result in a decreased supply of blood to your arms, legs, and internal organs, like your stomach or kidneys. However, it most often affects a person's lower legs and feet. There are two types of PVD.  Organic PVD. This is the more common type. It is caused by damage to the structure of blood vessels.  Functional PVD. This is caused by conditions that make blood vessels contract and tighten (spasm). Without treatment, PVD tends to get worse over time. PVD can also lead to acute ischemic limb. This is when an arm or limb suddenly has trouble getting enough blood. This is a medical emergency. CAUSES Each type of PVD has many different causes. The most common cause of PVD is buildup of a fatty material (plaque) inside of your arteries (atherosclerosis). Small amounts of plaque can break off from the walls of the blood vessels and become lodged in a smaller artery. This blocks blood flow and can cause acute ischemic limb. Other common causes of PVD include:  Blood clots that form inside of blood vessels.  Injuries to blood vessels.  Diseases that cause inflammation of blood vessels or cause blood vessel spasms.  Health behaviors and health history that increase your risk of developing PVD. RISK FACTORS  You may have a greater risk of PVD if you:  Have a family history of PVD.  Have certain medical conditions, including:  High cholesterol.  Diabetes.  High blood pressure (hypertension).  Coronary heart disease.  Past problems with blood clots.  Past injury, such as burns or a broken bone. These may have damaged blood vessels in your limbs.  Buerger disease. This is caused by inflamed blood  vessels in your hands and feet.  Some forms of arthritis.  Rare birth defects that affect the arteries in your legs.  Use tobacco.  Do not get enough exercise.  Are obese.  Are age 50 or older. SIGNS AND SYMPTOMS  PVD may cause many different symptoms. Your symptoms depend on what part of your body is not getting enough blood. Some common signs and symptoms include:  Cramps in your lower legs. This may be a symptom of poor leg circulation (claudication).  Pain and weakness in your legs while you are physically active that goes away when you rest (intermittent claudication).  Leg pain when at rest.  Leg numbness, tingling, or weakness.  Coldness in a leg or foot, especially when compared with the other leg.  Skin or hair changes. These can include:  Hair loss.  Shiny skin.  Pale or bluish skin.  Thick toenails.  Inability to get or maintain an erection (erectile dysfunction). People with PVD are more prone to developing ulcers and sores on their toes, feet, or legs. These may take longer than normal to heal. DIAGNOSIS Your health care provider may diagnose PVD from your signs and symptoms. The health care provider will also do a physical exam. You may have tests to find out what is causing your PVD and determine its severity. Tests may include:  Blood pressure recordings from your arms and legs and measurements of the strength of your pulses (pulse volume recordings).  Imaging studies using sound waves to take pictures of   the blood flow through your blood vessels (Doppler ultrasound).  Injecting a dye into your blood vessels before having imaging studies using:  X-rays (angiogram or arteriogram).  Computer-generated X-rays (CT angiogram).  A powerful electromagnetic field and a computer (magnetic resonance angiogram or MRA). TREATMENT Treatment for PVD depends on the cause of your condition and the severity of your symptoms. It also depends on your age. Underlying  causes need to be treated and controlled. These include long-lasting (chronic) conditions, such as diabetes, high cholesterol, and high blood pressure. You may need to first try making lifestyle changes and taking medicines. Surgery may be needed if these do not work. Lifestyle changes may include:  Quitting smoking.  Exercising regularly.  Following a low-fat, low-cholesterol diet. Medicines may include:  Blood thinners to prevent blood clots.  Medicines to improve blood flow.  Medicines to improve your blood cholesterol levels. Surgical procedures may include:  A procedure that uses an inflated balloon to open a blocked artery and improve blood flow (angioplasty).  A procedure to put in a tube (stent) to keep a blocked artery open (stent implant).  Surgery to reroute blood flow around a blocked artery (peripheral bypass surgery).  Surgery to remove dead tissue from an infected wound on the affected limb.  Amputation. This is surgical removal of the affected limb. This may be necessary in cases of acute ischemic limb that are not improved through medical or surgical treatments. HOME CARE INSTRUCTIONS  Take medicines only as directed by your health care provider.  Do not use any tobacco products, including cigarettes, chewing tobacco, or electronic cigarettes. If you need help quitting, ask your health care provider.  Lose weight if you are overweight, and maintain a healthy weight as directed by your health care provider.  Eat a diet that is low in fat and cholesterol. If you need help, ask your health care provider.  Exercise regularly. Ask your health care provider to suggest some good activities for you.  Use compression stockings or other mechanical devices as directed by your health care provider.  Take good care of your feet.  Wear comfortable shoes that fit well.  Check your feet often for any cuts or sores. SEEK MEDICAL CARE IF:  You have cramps in your legs  while walking.  You have leg pain when you are at rest.  You have coldness in a leg or foot.  Your skin changes.  You have erectile dysfunction.  You have cuts or sores on your feet that are not healing. SEEK IMMEDIATE MEDICAL CARE IF:  Your arm or leg turns cold and blue.  Your arms or legs become red, warm, swollen, painful, or numb.  You have chest pain or trouble breathing.  You suddenly have weakness in your face, arm, or leg.  You become very confused or lose the ability to speak.  You suddenly have a very bad headache or lose your vision.   This information is not intended to replace advice given to you by your health care provider. Make sure you discuss any questions you have with your health care provider.   Document Released: 04/25/2004 Document Revised: 04/08/2014 Document Reviewed: 08/26/2013 Elsevier Interactive Patient Education 2016 [employer]  

## 2015-07-26 NOTE — Progress Notes (Signed)
[facility] HISTORY AND PHYSICAL -PAD  History of Present Illness Pamela [name] is a 65 y.o. female patient of Dr. Edilia Bo who is status post left femoropopliteal bypass graft and left transmetatarsal amputation for extensive wound of the left foot on 04/04/11. She has a history of ulcers on both feet, ulcer is healed on right heel.  Stroke symptoms in the Summer of 2014; pt states evaluated by a neurologist who said it was not a stroke, was related to how she took her medications, she eventually was intubated. Stroke was manifested as confusion when taking her medications, denies MI or infection at this time. Carotid Duplex on 06/22/14 suggested minimal bilateral ICA stenosis.   No lateralizing symptoms at this time, just generalized weakness. Has some residual mild memory issues.  Patient reports an infection with perforated colon, needed colectomy end of 2015, hospitalized until February 2016, states she is deconditioned from this, received physical therapy for strengthening.  Pt states she is not able to walk due to her back issues. She is unsteady on her feet, has her own wheelchair. She has been told that she has gout in her partially amputated left foot.  She is seeing a Careers adviser in [place], [place] that is helping with her left heel ulcer; pt states this is healing well. A skin graft is recommended by this surgeon, Dr. Colette Ribas.  Pt is changing the heel ulcer dressing daily, HH sees pt twice/week.  Pt Diabetic: Yes, states her last A1C was about 7.? Pt smoker: former smoker, quit about 2012  Pt meds include: Statin :No, states all statins caused chest pain ASA: yes Other anticoagulants/antiplatelets: no, does not want to take as she states she relates this to her brother hemorrhaged while on anticoagulants and died.   Past Medical History  Diagnosis Date  . Diabetes mellitus   . GERD (gastroesophageal reflux disease)   . Gout   . Hypertension   . High  cholesterol   . Arthritis   . COPD (chronic obstructive pulmonary disease) (HCC)   . Peripheral arterial disease [facility])     Social History Social History  Substance Use Topics  . Smoking status: Former Smoker -- 0.00 packs/day for 45 years    Types: Cigarettes    Quit date: 04/12/2011  . Smokeless tobacco: Never Used  . Alcohol Use: No    Family History Family History  Problem Relation Age of Onset  . Hyperlipidemia Father   . Heart disease Father     Heart Disease before age 64  . Heart attack Father   . Cancer Father     Throat  . Heart disease Brother     Heart Disease before age 60  . Hyperlipidemia Brother   . Hypertension Brother   . Stroke Brother   . Heart attack Brother   . Hypertension Mother   . COPD Brother   . Hyperlipidemia Brother   . Hypertension Brother   . COPD Brother   . Hyperlipidemia Brother   . Hypertension Brother     Past Surgical History  Procedure Laterality Date  . Cesarean section  04/11/1986  . Femoral-popliteal bypass graft  04/04/2011    Procedure: BYPASS GRAFT FEMORAL-POPLITEAL ARTERY;  Surgeon: Chuck Hint, MD;  Location[facility] OR;  Service: Vascular;  Laterality: Left;  left femoral-above knee popliteal bypass graft using left  non reverse greater saphenous vein  . Amputation  04/04/2011    Procedure: AMPUTATION DIGIT;  Surgeon: Chuck Hint, MD;  Location: [facility] OR;  Service: Vascular;  Laterality: Left;   left forefoot amputation  . Eye surgery      Bilateral cataract  . Abdominal angiogram N/A 04/01/2011    Procedure: ABDOMINAL ANGIOGRAM;  Surgeon: Chuck Hinthristopher S Dickson, MD;  [facility];  Service: Cardiovascular;  Laterality: N/A;  . Lower extremity angiogram  04/01/2011    Procedure: LOWER EXTREMITY ANGIOGRAM;  Surgeon: Chuck Hinthristopher S Dickson, MD;  [facility];  Service: Cardiovascular;;  . Colon surgery  Dec. 2015    Partial Colon, Ovary and Uterine surgery  . Debridement  foot      Allergies   Allergen Reactions  . Azo [Phenazopyridine Hcl] Itching, Swelling and Rash  . Ciprofloxacin     According to MD notes pt has allergy to Cipro  . Codeine Itching and Rash    Current Outpatient Prescriptions  Medication Sig Dispense Refill  . allopurinol (ZYLOPRIM) 300 MG tablet Take 300 mg by mouth daily.      Marland Kitchen. amLODipine (NORVASC) 10 MG tablet Take 10 mg by mouth daily.      . B-D ULTRAFINE III SHORT PEN 31G X 8 MM MISC     . Cyanocobalamin (VITAMIN B-12 PO) Take 1 tablet by mouth daily.      . ergocalciferol (VITAMIN D2) 50000 UNITS capsule Take 50,000 Units by mouth once a week.     Marland Kitchen. HYDROcodone-acetaminophen (NORCO) 10-325 MG per tablet as needed.  0  . losartan (COZAAR) 100 MG tablet     . losartan (COZAAR) 25 MG tablet Take 25 mg by mouth every morning.  3  . NOVOLOG FLEXPEN 100 UNIT/ML FlexPen INJECT UP TO 90 UNITS SUBCUTANEOUSLY DAILY  2  . omega-3 acid ethyl esters (LOVAZA) 1 G capsule Take 4 g by mouth daily.      Marland Kitchen. oxyCODONE-acetaminophen (PERCOCET) 5-325 MG per tablet Take 1 tablet by mouth every 4 (four) hours as needed.    . silver sulfADIAZINE (SILVADENE) 1 % cream continuous as needed.    Marland Kitchen. amoxicillin (AMOXIL) 500 MG capsule Take 1 capsule by mouth 2 (two) times daily. Reported on 07/26/2015    . atorvastatin (LIPITOR) 10 MG tablet Take 10 mg by mouth daily. Reported on 07/26/2015    . B Complex-C-Min-Fe-FA (HEMATINIC PLUS VIT/MINERALS) TABS Reported on 07/26/2015    . BAYER BREEZE 2 TEST DISK Reported on 07/26/2015    . BAYER MICROLET LANCETS lancets Reported on 07/26/2015    . BD INSULIN SYRINGE ULTRAFINE 31G X 5/16" 0.3 ML MISC Reported on 07/26/2015    . cyclobenzaprine (FLEXERIL) 10 MG tablet Take 10 mg by mouth 3 (three) times daily as needed. Reported on 07/26/2015    . diazepam (VALIUM) 5 MG tablet Take 1 tablet by mouth every 6 (six) hours. Reported on 07/26/2015    . ferrous sulfate 325 (65 FE) MG tablet Take 1 tablet (325 mg total) by mouth 2 (two) times daily with  a meal.    . gabapentin (NEURONTIN) 800 MG tablet Take 800 mg by mouth 3 (three) times daily. Reported on 07/26/2015    . hydrOXYzine (ATARAX/VISTARIL) 25 MG tablet Reported on 07/26/2015    . insulin aspart (NOVOLOG) 100 UNIT/ML injection Inject 0-15 Units into the skin 3 (three) times daily with meals. 1 vial   . insulin aspart (NOVOLOG) 100 UNIT/ML injection Inject 0-5 Units into the skin at bedtime. 1 vial   . insulin glargine (LANTUS) 100 UNIT/ML injection Inject 26 Units into the skin at  bedtime. 10 mL   . insulin glargine (LANTUS) 100 UNIT/ML injection Inject 30 Units into the skin daily. 10 mL   . metoprolol tartrate (LOPRESSOR) 25 MG tablet Take 1 tablet (25 mg total) by mouth 2 (two) times daily.    Marland Kitchen oxyCODONE (OXY IR/ROXICODONE) 5 MG immediate release tablet Take 5 mg by mouth every 8 (eight) hours. Reported on 07/26/2015  0  . sulfamethoxazole-trimethoprim (BACTRIM DS) 800-160 MG per tablet Take 1 tablet by mouth 2 (two) times daily. Reported on 07/26/2015    . VENTOLIN HFA 108 (90 BASE) MCG/ACT inhaler Reported on 07/26/2015    . zolpidem (AMBIEN) 5 MG tablet Take 1 tablet (5 mg total) by mouth at bedtime as needed for sleep (insomnia). 30 tablet 0   No current facility-administered medications for this visit.    ROS: See HPI for pertinent positives and negatives.   Physical Examination  Filed Vitals:   07/26/15 1443  BP: 134/73  Pulse: 78  Temp: 98.4 F (36.9 C)  TempSrc: Oral  Height:  (1.727 m)  Weight: 201 lb (91.173 kg)  SpO2: 93%   Body mass index is 30.57 kg/(m^2).  General: A&O x 3, WDWN, obese female. Gait: in wheelchair from home Eyes: PERRLA, Pulmonary: CTAB, without wheezes , rales or rhonchi Cardiac: regular rhythm, no detected murmur     Carotid Bruits Left Right   Negative Negative  Radial pulses: are 2+ and =   VASCULAR EXAM: Extremities without ischemic changes: no dependent rubor, no  cyanosis. Left foot transmetatarsal amputation stump is well healed. without Gangrene; with open wound on medial aspect left heel, 6 cm x 4.5 cm, epidermal layer deep. Bilateral LE with trace pretibial pitting edema.     LE Pulses LEFT RIGHT   FEMORAL Not palpable  Not palpable sitting in w/c, obese   POPLITEAL not palpable  not palpable   POSTERIOR TIBIAL monophasic by Doppler and not palpable  monophasic by Doppler and not palpable    DORSALIS PEDIS  ANTERIOR TIBIAL biphasic by Doppler and faintly palpable monophasic by Doppler and not palpable    Abdomen: soft, NT, no palpable masses. Skin: no rashes, see Extremities. Musculoskeletal: no muscle wasting or atrophy. Neurologic: A&O X 3; Appropriate Affect; MOTOR FUNCTION: moving all extremities equally, motor strength 4/5 throughout. Speech is fluent/normal. CN 2-12 intact.                      Non-Invasive Vascular Imaging: DATE: 07/26/2015 LOWER EXTREMITY ARTERIAL DUPLEX EVALUATION    INDICATION: PVD    PREVIOUS INTERVENTION(S): Left femoropopliteal bypass graft 04/04/2011; Left transmetatarsal amputation    DUPLEX EXAM:     RIGHT  LEFT   Peak Systolic Velocity (cm/s) Ratio (if abnormal) Waveform  Peak Systolic Velocity (cm/s) Ratio (if abnormal) Waveform     Inflow Artery 241  T     Proximal Anastomosis 229  B     Proximal Graft 62  B     Mid Graft 89  T      Distal Graft 123  B-T     Distal Anastomosis 147  T     Outflow Artery 170  B-T  1.5 Today's ABI / TBI 1.6  [place] / .76 Previous ABI / TBI (  11/17/14) [place]    Waveform:    M - Monophasic       B - Biphasic       T - Triphasic  If Ankle Brachial Index (ABI) or Toe  Brachial Index (TBI) performed, please see complete report     ADDITIONAL FINDINGS:      IMPRESSION: 1. Widely patent left femoropopliteal bypass graft without evidence of restenosis.    Compared to the previous exam:  No change since prior exam     ASSESSMENT: Pamela [name] is a 65 y.o. female who is status post left femoropopliteal bypass graft and left transmetatarsal amputation for extensive wound of the left foot on 04/04/11. Her DM seems stable with A1C remaining in the 7's, she stopped tobacco use in 2012. Pt states she is not able to walk due to her back issues, does not elicit claudication sx's, has no rest pain. She has a history of ulcers on both feet; the right heel ulcer has healed.  The left heel ulcer is about the same size as last visit, but pt states is is healing with her daily dressing changes, twice weekly HH visits, and help from a surgeon in [place], [place] where she resides. She does not want to return to the wound care center.    Today's left LE arterial duplex suggests a widely patent left femoropopliteal bypass graft without evidence of restenosis. ABI's are supra systolic indicating calcified vessels, a complication of her DM and smoking until 2012. Right LE waveforms are monophasic, left are biphasic DP and monophasic PT.   PLAN:  Reviewed and demonstrated daily seated leg exercises. Based on the patient's vascular studies and examination, pt will return to clinic in 6 months with ABI's and left LE arterial duplex. I advised pt to notify us if she develops concerns re the circulation in her legs.  I discussed in depth with the patient the nature of atherosclerosis, and emphasized the importance of maximal medical management including strict control of blood pressure, blood glucose, and lipid levels, obtaining regular exercise, and continued cessation of smoking.  The patient is aware that without maximal medical management the underlying atherosclerotic disease process will progress, limiting the benefit of any interventions.  The patient was  given information about PAD including signs, symptoms, treatment, what symptoms should prompt the patient to seek immediate medical care, and risk reduction measures to take.  Charisse March, RN, MSN, FNP-C [facility] Phone: [phone]  Clinic MD: Edilia Bo  07/26/2015 3:02 PM

## 2015-08-29 NOTE — Addendum Note (Signed)
Addended by: Sharee PimpleMCCHESNEY, Dinero Chavira K on: 08/29/2015 04:59 PM   Modules accepted: Orders

## 2016-01-25 ENCOUNTER — Encounter: Payer: Self-pay | Admitting: Vascular Surgery

## 2016-01-31 ENCOUNTER — Inpatient Hospital Stay (HOSPITAL_COMMUNITY): Admission: RE | Admit: 2016-01-31 | Payer: Medicare Other | Source: Ambulatory Visit

## 2016-01-31 ENCOUNTER — Ambulatory Visit: Payer: Medicare Other | Admitting: Family

## 2016-09-29 DEATH — deceased
